# Patient Record
Sex: Male | Born: 1960 | Race: White | Hispanic: No | Marital: Married | Smoking: Never smoker
Health system: Southern US, Community
[De-identification: ages and names within clinical notes are randomized; demographics above are authoritative.]

## PROBLEM LIST (undated history)

## (undated) DIAGNOSIS — J45909 Unspecified asthma, uncomplicated: Secondary | ICD-10-CM

## (undated) DIAGNOSIS — J302 Other seasonal allergic rhinitis: Secondary | ICD-10-CM

## (undated) HISTORY — DX: Unspecified asthma, uncomplicated: J45.909

## (undated) HISTORY — DX: Other seasonal allergic rhinitis: J30.2

---
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
## 2006-01-22 ENCOUNTER — Emergency Department (HOSPITAL_COMMUNITY): Admission: EM | Admit: 2006-01-22 | Discharge: 2006-01-22 | Payer: Self-pay | Admitting: Family Medicine

## 2006-09-10 ENCOUNTER — Emergency Department (HOSPITAL_COMMUNITY): Admission: EM | Admit: 2006-09-10 | Discharge: 2006-09-10 | Payer: Self-pay | Admitting: Emergency Medicine

## 2006-11-27 ENCOUNTER — Emergency Department (HOSPITAL_COMMUNITY): Admission: EM | Admit: 2006-11-27 | Discharge: 2006-11-27 | Payer: Self-pay | Admitting: Emergency Medicine

## 2009-02-23 ENCOUNTER — Emergency Department (HOSPITAL_COMMUNITY): Admission: EM | Admit: 2009-02-23 | Discharge: 2009-02-23 | Payer: Self-pay | Admitting: Family Medicine

## 2009-03-06 ENCOUNTER — Emergency Department (HOSPITAL_COMMUNITY): Admission: EM | Admit: 2009-03-06 | Discharge: 2009-03-06 | Payer: Self-pay | Admitting: Family Medicine

## 2011-01-12 IMAGING — CR DG CHEST 2V
2 series · 2 of 2 positions shown · non-contrast
Comparison: None

CLINICAL DATA: Fever, cough

CHEST - 2 VIEW

[view not recorded (1 of 2)]
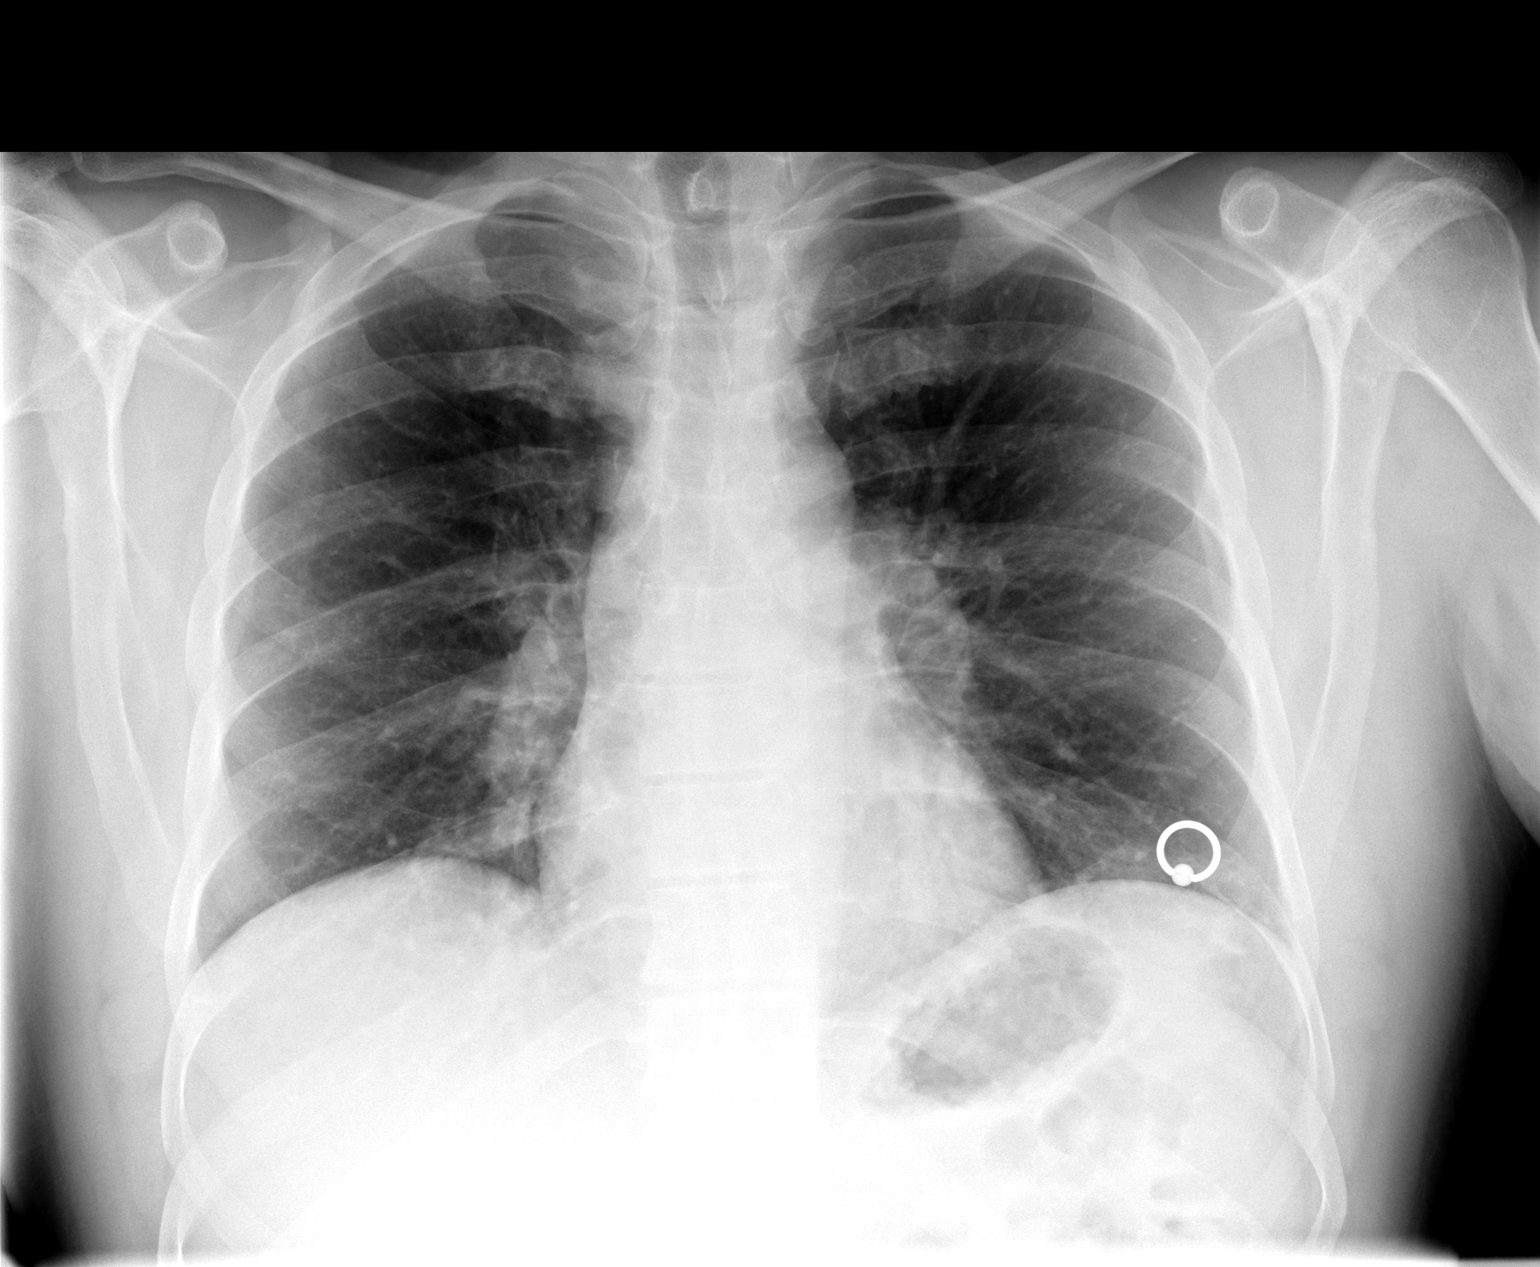

[view not recorded (2 of 2)]
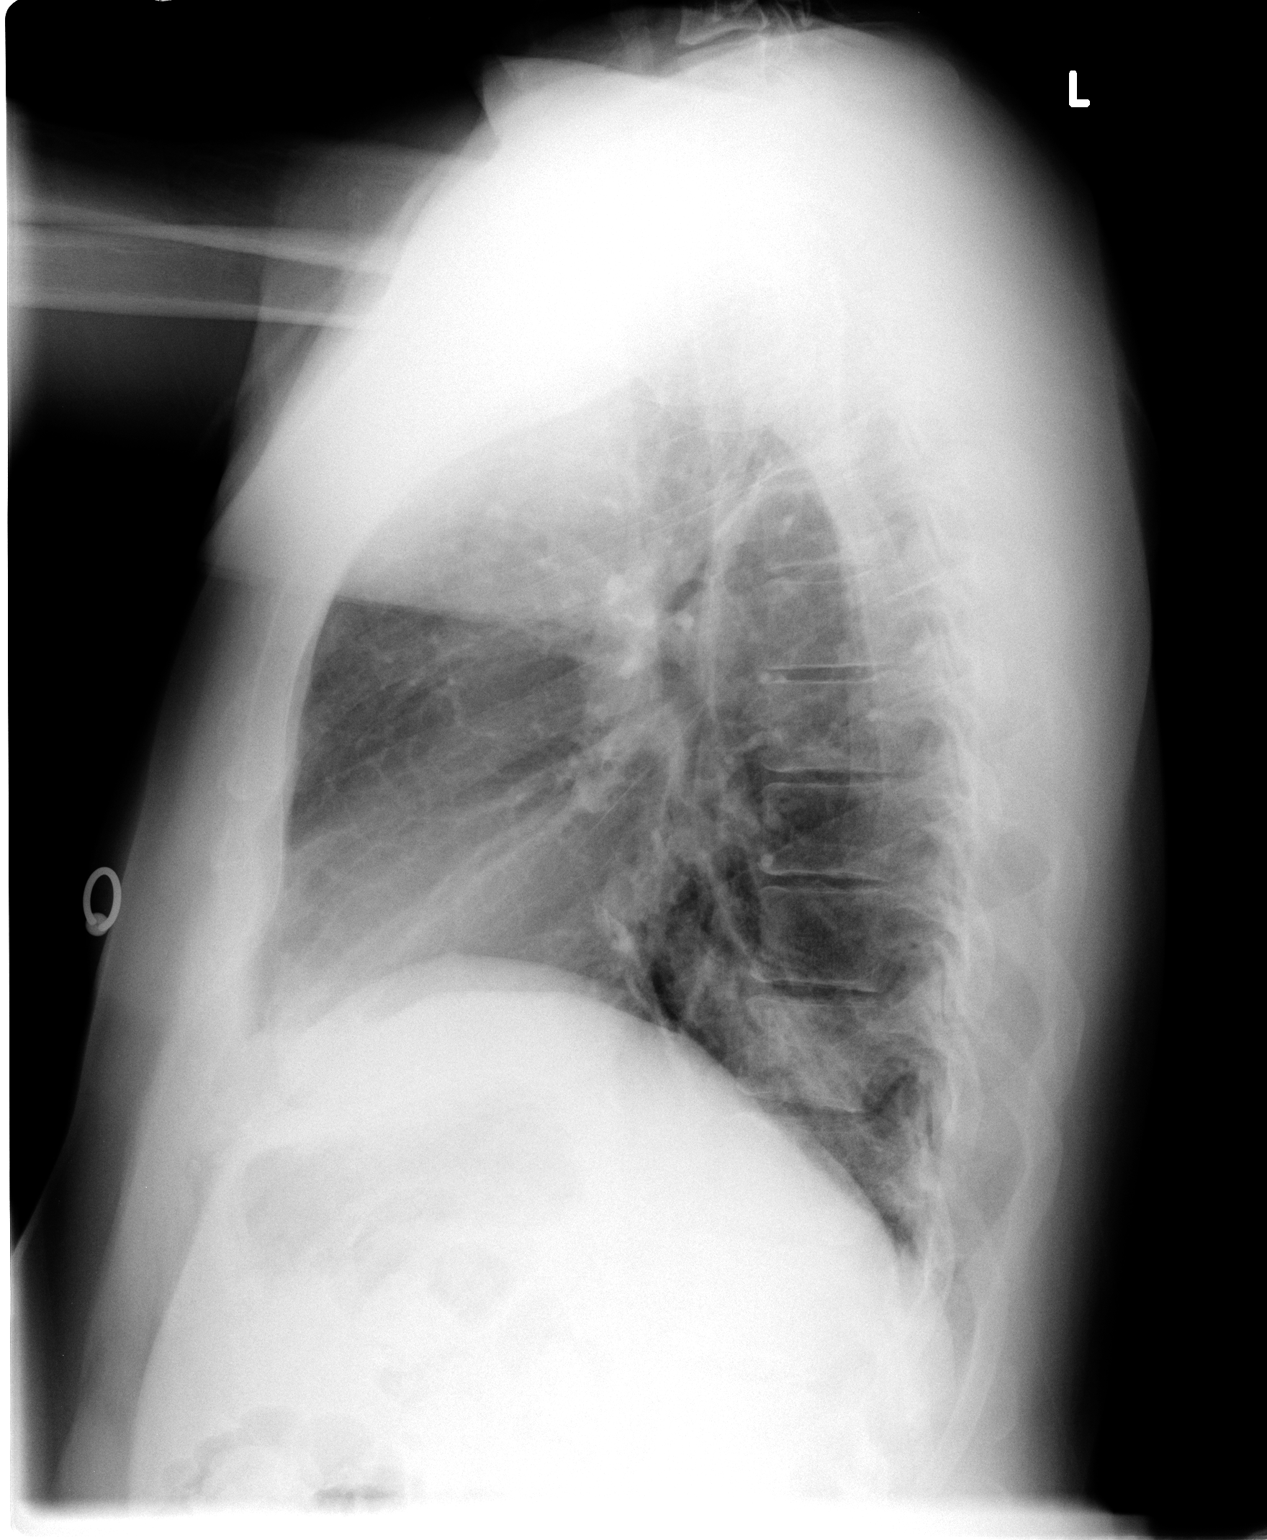

[2 of 2 positions shown; findings below may reference images not displayed]

FINDINGS: Right nipple ring.
Normal heart size, mediastinal contours and pulmonary vascularity.
Bronchitic changes with probable infiltrate left lower lobe.
Remaining lungs show minimal interstitial prominence but are
otherwise clear.
No pleural effusion or pneumothorax.
Bones unremarkable.
IMPRESSION: Probable left lower lobe infiltrate.

## 2012-01-27 HISTORY — PX: LIGATION PRQ VAS DEFERENS UNI/BI SPX: 55450

## 2012-12-15 ENCOUNTER — Emergency Department (HOSPITAL_COMMUNITY)
Admission: EM | Admit: 2012-12-15 | Discharge: 2012-12-15 | Disposition: A | Discharge status: Home / Self Care | Payer: Self-pay | Source: Home / Self Care | Attending: Family Medicine | Admitting: Family Medicine

## 2012-12-15 ENCOUNTER — Encounter (HOSPITAL_COMMUNITY): Payer: Self-pay | Admitting: Emergency Medicine

## 2012-12-15 DIAGNOSIS — H60399 Other infective otitis externa, unspecified ear: Secondary | ICD-10-CM

## 2012-12-15 DIAGNOSIS — H6091 Unspecified otitis externa, right ear: Secondary | ICD-10-CM

## 2012-12-15 MED ORDER — AMOXICILLIN-POT CLAVULANATE 875-125 MG PO TABS: 1.0000 | ORAL_TABLET | Freq: Two times a day (BID) | ORAL | Status: DC

## 2012-12-15 MED ORDER — CIPROFLOXACIN-HYDROCORTISONE 0.2-1 % OT SUSP: 3.0000 [drp] | Freq: Two times a day (BID) | OTIC | Status: DC

## 2012-12-15 NOTE — ED Provider Notes (Signed)
Carl Roth is a 52 y.o. male who presents to Urgent Care today for right ear pain for the last 2 days. Patient denies any fevers or chills nausea vomiting diarrhea or congestion. He feels well otherwise. He has not tried any medications yet. The pain is mild to moderate and worse with ear motion.   History reviewed. No pertinent past medical history. History  Substance Use Topics  . Smoking status: Never Smoker   . Smokeless tobacco: Not on file  . Alcohol Use: Yes   ROS as above Medications reviewed. No current facility-administered medications for this encounter.   Current Outpatient Prescriptions  Medication Sig Dispense Refill  . amoxicillin-clavulanate (AUGMENTIN) 875-125 MG per tablet Take 1 tablet by mouth every 12 (twelve) hours.  14 tablet  0  . ciprofloxacin-hydrocortisone (CIPRO HC) otic suspension Place 3 drops into the right ear 2 (two) times daily.  10 mL  0    Exam:  BP 121/82  Pulse 68  Temp(Src) 97.9 F (36.6 C) (Oral)  Resp 14  SpO2 97% Gen: Well NAD HEENT: EOMI,  MMM, erythematous right auditory canal and  tympanic membrane. No effusion. Left is normal appearing Lungs: Normal work of breathing. CTABL Heart: RRR no MRG Abd: NABS, Soft. NT, ND Exts: Non edematous BL  LE, warm and well perfused.   No results found for this or any previous visit (from the past 24 hour(s)). No results found.  Assessment and Plan: 52 y.o. male with right ear otitis externa.  Plan to treat with Cipro HC ear drops. Additionally I have prescribed Augmentin for use if he does not improve.  Followup as needed.  Discussed warning signs or symptoms. Please see discharge instructions. Patient expresses understanding.      Rodolph Bong, MD 12/15/12 [phone]

## 2012-12-15 NOTE — ED Notes (Signed)
C/o pain in ear since Tuesday; NAD

## 2013-01-03 ENCOUNTER — Encounter: Payer: Self-pay | Admitting: Internal Medicine

## 2013-03-03 ENCOUNTER — Encounter: Payer: Self-pay | Admitting: Internal Medicine

## 2013-03-03 ENCOUNTER — Ambulatory Visit (AMBULATORY_SURGERY_CENTER): Payer: Self-pay | Admitting: *Deleted

## 2013-03-03 VITALS — Ht 72.0 in | Wt 227.6 lb

## 2013-03-03 DIAGNOSIS — Z1211 Encounter for screening for malignant neoplasm of colon: Secondary | ICD-10-CM

## 2013-03-03 MED ORDER — MOVIPREP 100 G PO SOLR: ORAL | Status: DC

## 2013-03-03 NOTE — Progress Notes (Signed)
No allergies to eggs or soy. No prior anesthesia.  

## 2013-03-17 ENCOUNTER — Ambulatory Visit (AMBULATORY_SURGERY_CENTER): Payer: BC Managed Care – PPO | Admitting: Internal Medicine

## 2013-03-17 ENCOUNTER — Encounter: Payer: Self-pay | Admitting: Internal Medicine

## 2013-03-17 VITALS — BP 105/74 | HR 56 | Temp 97.8°F | Resp 19 | Ht 72.0 in | Wt 227.0 lb

## 2013-03-17 DIAGNOSIS — D126 Benign neoplasm of colon, unspecified: Secondary | ICD-10-CM

## 2013-03-17 DIAGNOSIS — Z1211 Encounter for screening for malignant neoplasm of colon: Secondary | ICD-10-CM

## 2013-03-17 MED ORDER — SODIUM CHLORIDE 0.9 % IV SOLN: 500.0000 mL | INTRAVENOUS | Status: DC

## 2013-03-17 NOTE — Op Note (Signed)
[facility]  [address]. [name], [id number]   COLONOSCOPY PROCEDURE REPORT  PATIENT: Carl, Roth  MR#: [id number] BIRTHDATE: 12/24/1960 , 53  yrs. old GENDER: Male ENDOSCOPIST: Jerene Bears, MD PROCEDURE DATE:  03/17/2013 PROCEDURE:   Colonoscopy with snare polypectomy First Screening Colonoscopy - Avg.  risk and is 50 yrs.  old or older Yes.  Prior Negative Screening - Now for repeat screening. N/A  History of Adenoma - Now for follow-up colonoscopy & has been > or = to 3 yrs.  N/A  Polyps Removed Today? Yes. ASA CLASS:   Class II INDICATIONS:average risk screening and first colonoscopy. MEDICATIONS: MAC sedation, administered by CRNA and propofol (Diprivan) 350mg  IV  DESCRIPTION OF PROCEDURE:   After the risks benefits and alternatives of the procedure were thoroughly explained, informed consent was obtained.  A digital rectal exam revealed no rectal mass.   The LB [id number]  endoscope was introduced through the anus and advanced to the cecum, which was identified by both the appendix and ileocecal valve. No adverse events experienced. The quality of the prep was good, using MoviPrep  The instrument was then slowly withdrawn as the colon was fully examined.   COLON FINDINGS: Three sessile polyps ranging between 3-73mm in size were found in the proximal transverse colon (2) and rectosigmoid colon (1).  Polypectomy was performed using cold snare.  All resections were complete and all polyp tissue was completely retrieved.   The colon mucosa was otherwise normal.  Retroflexed views revealed small internal hemorrhoids. The time to cecum=5 minutes 20 seconds.  Withdrawal time=15 minutes 13 seconds.  The scope was withdrawn and the procedure completed. COMPLICATIONS: There were no complications.  ENDOSCOPIC IMPRESSION: 1.   Three sessile polyps ranging between 3-11mm in size were found in the proximal transverse colon and rectosigmoid  colon; Polypectomy was performed using cold snare 2.   The colon mucosa was otherwise normal  RECOMMENDATIONS: 1.  Await pathology results 2.  Timing of repeat colonoscopy will be determined by pathology findings. 3.  You will receive a letter within 1-2 weeks with the results of your biopsy as well as final recommendations.  Please call my office if you have not received a letter after 3 weeks.   eSigned:  Jerene Bears, MD 03/17/2013 9:37 AM   cc: The Patient

## 2013-03-17 NOTE — Progress Notes (Signed)
Called to room to assist during endoscopic procedure.  Patient ID and intended procedure confirmed with present staff. Received instructions for my participation in the procedure from the performing physician.  

## 2013-03-17 NOTE — Patient Instructions (Signed)
YOU HAD AN ENDOSCOPIC PROCEDURE TODAY AT THE [facility]: Refer to the procedure report that was given to you for any specific questions about what was found during the examination.  If the procedure report does not answer your questions, please call your gastroenterologist to clarify.  If you requested that your care partner not be given the details of your procedure findings, then the procedure report has been included in a sealed envelope for you to review at your convenience later.  YOU SHOULD EXPECT: Some feelings of bloating in the abdomen. Passage of more gas than usual.  Walking can help get rid of the air that was put into your GI tract during the procedure and reduce the bloating. If you had a lower endoscopy (such as a colonoscopy or flexible sigmoidoscopy) you may notice spotting of blood in your stool or on the toilet paper. If you underwent a bowel prep for your procedure, then you may not have a normal bowel movement for a few days.  DIET: Your first meal following the procedure should be a light meal and then it is ok to progress to your normal diet.  A half-sandwich or bowl of soup is an example of a good first meal.  Heavy or fried foods are harder to digest and may make you feel nauseous or bloated.  Likewise meals heavy in dairy and vegetables can cause extra gas to form and this can also increase the bloating.  Drink plenty of fluids but you should avoid alcoholic beverages for 24 hours.  ACTIVITY: Your care partner should take you home directly after the procedure.  You should plan to take it easy, moving slowly for the rest of the day.  You can resume normal activity the day after the procedure however you should NOT DRIVE or use heavy machinery for 24 hours (because of the sedation medicines used during the test).    SYMPTOMS TO REPORT IMMEDIATELY: A gastroenterologist can be reached at any hour.  During normal business hours, 8:30 AM to 5:00 PM Monday through Friday,  call [phone].  After hours and on weekends, please call the GI answering service at [phone] who will take a message and have the physician on call contact you.   Following lower endoscopy (colonoscopy or flexible sigmoidoscopy):  Excessive amounts of blood in the stool  Significant tenderness or worsening of abdominal pains  Swelling of the abdomen that is new, acute  Fever of 100F or higher    FOLLOW UP: If any biopsies were taken you will be contacted by phone or by letter within the next 1-3 weeks.  Call your gastroenterologist if you have not heard about the biopsies in 3 weeks.  Our staff will call the home number listed on your records the next business day following your procedure to check on you and address any questions or concerns that you may have at that time regarding the information given to you following your procedure. This is a courtesy call and so if there is no answer at the home number and we have not heard from you through the emergency physician on call, we will assume that you have returned to your regular daily activities without incident.  SIGNATURES/CONFIDENTIALITY: You and/or your care partner have signed paperwork which will be entered into your electronic medical record.  These signatures attest to the fact that that the information above on your After Visit Summary has been reviewed and is understood.  Full responsibility of the confidentiality   of this discharge information lies with you and/or your care-partner.     

## 2013-03-20 ENCOUNTER — Telehealth: Payer: Self-pay | Admitting: *Deleted

## 2013-03-20 NOTE — Telephone Encounter (Signed)
Left message that we called for f/u 

## 2013-03-22 ENCOUNTER — Encounter: Payer: Self-pay | Admitting: Internal Medicine

## 2013-03-29 ENCOUNTER — Encounter: Payer: Self-pay | Admitting: *Deleted

## 2014-05-20 ENCOUNTER — Encounter (HOSPITAL_COMMUNITY): Payer: Self-pay | Admitting: Emergency Medicine

## 2014-05-20 ENCOUNTER — Emergency Department (HOSPITAL_COMMUNITY)
Admission: EM | Admit: 2014-05-20 | Discharge: 2014-05-20 | Disposition: A | Discharge status: Home / Self Care | Payer: BC Managed Care – PPO | Source: Home / Self Care

## 2014-05-20 DIAGNOSIS — J309 Allergic rhinitis, unspecified: Secondary | ICD-10-CM | POA: Diagnosis not present

## 2014-05-20 DIAGNOSIS — H6593 Unspecified nonsuppurative otitis media, bilateral: Secondary | ICD-10-CM

## 2014-05-20 MED ORDER — CETIRIZINE-PSEUDOEPHEDRINE ER 5-120 MG PO TB12: 1.0000 | ORAL_TABLET | Freq: Every day | ORAL | Status: AC

## 2014-05-20 MED ORDER — FLUTICASONE PROPIONATE 50 MCG/ACT NA SUSP: 2.0000 | Freq: Every day | NASAL | Status: AC

## 2014-05-20 NOTE — ED Notes (Signed)
C/o  Sinus pressure and tightness in head and chest.  Denies fever and cough.  No relief with allergy meds.  Symptoms present x 2 days.

## 2014-05-20 NOTE — Discharge Instructions (Signed)
A neti pot is a container designed to rinse debris or mucus from your nasal cavity. You might use a neti pot to treat symptoms of nasal allergies, sinus problems or colds. If you choose to make your own saltwater solution, it's important to use bottled water that has been distilled or sterilized. Tap water is acceptable if it's been boiled for several minutes and then left to cool until it is lukewarm. To use the neti pot, tilt your head sideways over the sink and place the spout of the neti pot in the upper nostril. Breathing through your open mouth, gently pour the saltwater solution into your upper nostril so that the liquid drains through the lower nostril. Repeat on the other side. Be sure to rinse the irrigation device after each use with similarly distilled, sterile, previously boiled and cooled, or filtered water and leave open to air dry. Neti pots are often available in pharmacies and health food stores, as well online.    Allergic Rhinitis Allergic rhinitis is when the mucous membranes in the nose respond to allergens. Allergens are particles in the air that cause your body to have an allergic reaction. This causes you to release allergic antibodies. Through a chain of events, these eventually cause you to release histamine into the blood stream. Although meant to protect the body, it is this release of histamine that causes your discomfort, such as frequent sneezing, congestion, and an itchy, runny nose.  CAUSES  Seasonal allergic rhinitis (hay fever) is caused by pollen allergens that may come from grasses, trees, and weeds. Year-round allergic rhinitis (perennial allergic rhinitis) is caused by allergens such as house dust mites, pet dander, and mold spores.  SYMPTOMS   Nasal stuffiness (congestion).  Itchy, runny nose with sneezing and tearing of the eyes. DIAGNOSIS  Your health care provider can help you determine the allergen or allergens that trigger your symptoms. If you and your  health care provider are unable to determine the allergen, skin or blood testing may be used. TREATMENT  Allergic rhinitis does not have a cure, but it can be controlled by:  Medicines and allergy shots (immunotherapy).  Avoiding the allergen. Hay fever may often be treated with antihistamines in pill or nasal spray forms. Antihistamines block the effects of histamine. There are over-the-counter medicines that may help with nasal congestion and swelling around the eyes. Check with your health care provider before taking or giving this medicine.  If avoiding the allergen or the medicine prescribed do not work, there are many new medicines your health care provider can prescribe. Stronger medicine may be used if initial measures are ineffective. Desensitizing injections can be used if medicine and avoidance does not work. Desensitization is when a patient is given ongoing shots until the body becomes less sensitive to the allergen. Make sure you follow up with your health care provider if problems continue. HOME CARE INSTRUCTIONS It is not possible to completely avoid allergens, but you can reduce your symptoms by taking steps to limit your exposure to them. It helps to know exactly what you are allergic to so that you can avoid your specific triggers. SEEK MEDICAL CARE IF:   You have a fever.  You develop a cough that does not stop easily (persistent).  You have shortness of breath.  You start wheezing.  Symptoms interfere with normal daily activities. Document Released: 10/07/2000 Document Revised: 01/17/2013 Document Reviewed: 09/19/2012 [facility] Patient Information 2015 [place], [place]. This information is not intended to replace advice given  to you by your health care provider. Make sure you discuss any questions you have with your health care provider.

## 2014-05-20 NOTE — ED Provider Notes (Signed)
CSN: [id number]     Arrival date & time 05/20/14  1026 History   None    Chief Complaint  Patient presents with  . Facial Pain   (Consider location/radiation/quality/duration/timing/severity/associated sxs/prior Treatment) HPI   Patient is a 55 year old male presenting today with complaints of congestion and a feeling of fullness and stuffiness in his head and in the ears for 2 days. He is concerned that he may have picked up something from his daughter who has been intermittently coughing for the past several weeks. He denies nausea, vomiting, and diarrhea. No fever, but positive for fatigue and just a general feeling of malaise. He regularly takes Claritin-D for seasonal allergies. Primary concern is ear pressure, stuffiness and scratchiness as he will be flying later this week.  Past Medical History  Diagnosis Date  . Seasonal allergies   . Asthma    Past Surgical History  Procedure Laterality Date  . Laceration repair Left 1980   Family History  Problem Relation Age of Onset  . Colon cancer Neg Hx    History  Substance Use Topics  . Smoking status: Never Smoker   . Smokeless tobacco: Never Used  . Alcohol Use: 8.4 oz/week    14 Cans of beer per week    Review of Systems  Constitutional: Positive for fatigue. Negative for fever, chills and diaphoresis.  HENT: Positive for congestion, postnasal drip, rhinorrhea, sinus pressure and sneezing. Negative for drooling, ear discharge, ear pain, facial swelling, hearing loss, sore throat, trouble swallowing and voice change.   Eyes: Negative.   Respiratory: Negative.  Negative for cough, shortness of breath and wheezing.   Cardiovascular: Negative.   Gastrointestinal: Negative.  Negative for nausea, vomiting and diarrhea.  Endocrine: Negative.   Genitourinary: Negative.   Skin: Negative.  Negative for color change, pallor, rash and wound.  Allergic/Immunologic: Positive for environmental allergies. Negative for food allergies and  immunocompromised state.  Neurological: Negative.   Hematological: Negative.   Psychiatric/Behavioral: Negative.     Allergies  Dust mite extract; Molds & smuts; and Pollen extract  Home Medications   Prior to Admission medications   Medication Sig Start Date End Date Taking? Authorizing Provider  ALBUTEROL IN Inhale into the lungs as needed.    Historical Provider, MD  budesonide-formoterol (SYMBICORT) 160-4.5 MCG/ACT inhaler Inhale 2 puffs into the lungs 2 (two) times daily.    Historical Provider, MD  cetirizine-pseudoephedrine (ZYRTEC-D) 5-120 MG per tablet Take 1 tablet by mouth daily. 05/20/14   Nehemiah Settle, NP  fluticasone (FLONASE) 50 MCG/ACT nasal spray Place 2 sprays into both nostrils daily. 05/20/14   Nehemiah Settle, NP   BP 141/80 mmHg  Pulse 73  Temp(Src) 98 F (36.7 C) (Oral)  Resp 16  SpO2 97%   Physical Exam  Constitutional: He is oriented to person, place, and time. He appears well-developed and well-nourished. No distress.  HENT:  Head: Normocephalic and atraumatic.  Right Ear: External ear normal.  Left Ear: External ear normal.  Mouth/Throat: Oropharynx is clear and moist. No oropharyngeal exudate.  Bilateral tympanic membranes pearly gray in appearance with light reflexes present and bony prominences visualized. Light reflexes distorted secondary to fluid present behind tympanic membranes. Only partially able to visualize bony prominences. Bilateral nares are patent but boggy and bluish in appearance. Posterior oropharynx has mild irritation consistent with postnasal drip. Cobblestoning pattern present; no evidence of exudate or patches.   Eyes: Conjunctivae are normal. Pupils are equal, round, and reactive to light. Right eye  exhibits no discharge. Left eye exhibits no discharge.  Neck: Normal range of motion. Neck supple.  Mild anterior cervical lymphadenopathy. No nuchal rigidity.  Cardiovascular: Normal rate, regular rhythm, normal heart sounds  and intact distal pulses.  Exam reveals no gallop and no friction rub.   No murmur heard. Pulmonary/Chest: Effort normal and breath sounds normal. No respiratory distress. He has no wheezes. He has no rales. He exhibits no tenderness.  No adventitious breath sounds noted.  Lymphadenopathy:    He has cervical adenopathy.  Neurological: He is alert and oriented to person, place, and time.  Skin: Skin is warm and dry. No rash noted. He is not diaphoretic. No pallor.  Nursing note and vitals reviewed.   ED Course  Procedures (including critical care time) Labs Review Labs Reviewed - No data to display  Imaging Review No results found.   MDM   1. Allergic rhinitis, unspecified allergic rhinitis type   2. Bilateral serous otitis media, recurrence not specified, unspecified chronicity    Meds ordered this encounter  Medications  . fluticasone (FLONASE) 50 MCG/ACT nasal spray    Sig: Place 2 sprays into both nostrils daily.    Dispense:  16 g    Refill:  2  . cetirizine-pseudoephedrine (ZYRTEC-D) 5-120 MG per tablet    Sig: Take 1 tablet by mouth daily.    Dispense:  15 tablet    Refill:  0   Discussed plan of care with patient. Patient switched from Manti to Zyrtec-D to try alternative medication to see if it improves allergic symptoms. In addition discussed the use of steroid nasal spray and saline irrigation for relief of symptoms and congestion. The patient verbalizes understanding and agrees to plan of care.       Nehemiah Settle, NP 05/20/14 1420

## 2017-09-30 ENCOUNTER — Ambulatory Visit (INDEPENDENT_AMBULATORY_CARE_PROVIDER_SITE_OTHER): Payer: No Typology Code available for payment source | Admitting: Urology

## 2017-09-30 ENCOUNTER — Encounter (INDEPENDENT_AMBULATORY_CARE_PROVIDER_SITE_OTHER): Payer: Self-pay | Admitting: Urology

## 2017-09-30 ENCOUNTER — Ambulatory Visit: Payer: No Typology Code available for payment source | Attending: Urology

## 2017-09-30 VITALS — BP 128/62 | HR 64 | Ht 72.0 in | Wt 219.6 lb

## 2017-09-30 DIAGNOSIS — R351 Nocturia: Secondary | ICD-10-CM | POA: Insufficient documentation

## 2017-09-30 DIAGNOSIS — R972 Elevated prostate specific antigen [PSA]: Secondary | ICD-10-CM | POA: Insufficient documentation

## 2017-09-30 DIAGNOSIS — Z6829 Body mass index (BMI) 29.0-29.9, adult: Secondary | ICD-10-CM

## 2017-09-30 LAB — LAB UNDEFINED ORCA/EPIC ORDER

## 2017-09-30 MED ORDER — CIPROFLOXACIN HCL 500 MG OR TABS
500.0000 mg | ORAL_TABLET | Freq: Two times a day (BID) | ORAL | 0 refills | Status: AC
Start: 2017-09-30 — End: 2017-10-01

## 2017-09-30 NOTE — Progress Notes (Signed)
UROLOGY NEW PATIENT VISIT    ID/CC:    Chief Complaint   Patient presents with    Elevated PSA     PSA 7.3       History of Present Illness:   Jeffery Elliott is a 57 year old male who presents today.  He is referred for an elevated PSA of 7.3.  His last PSA was several years ago he does have a history of his father having prostate cancer treated with radiation therapy in his 84s he has what sounds like some obstructive voiding symptoms with nocturia 3 and some difficulty when sitting to void    Review of Systems:   A 14 system review of system was filled out by the patient.  Positives include no other major medical problems.  The rest of the review of system is negative and can be linked to in the chart dated today.        Past Medical Hx:    Past Medical History:   Diagnosis Date    Asthma     Elevated PSA      Patient Active Problem List   Diagnosis    Elevated PSA       Past Surgical Hx:    Past Surgical History:   Procedure Laterality Date    LIGATION PRQ VAS DEFERENS UNI/BI SPX  2014       Family and Social Hx:    Jeffery Elliott , family history includes Heart Attack in his brother and father; Prostate Cancer in his father.Marland Kitchen He  reports that he has never smoked. He has never used smokeless tobacco. He reports that he drank alcohol. He reports that he has current or past drug history..  Social History     Social History Narrative    Not on file       Active Meds:    No outpatient medications have been marked as taking for the 09/30/17 encounter (Office Visit) with Daphine Deutscher, MD.       Allergies:    Allergies as of 09/30/2017 - Reviewed 09/30/2017   Allergen Reaction Noted    Dust mite extract  09/30/2017    Molds & smuts  09/30/2017    Pollen extract  09/30/2017       OBJECTIVE:  Physical Exam:  BP 128/62    Pulse 64    Ht 6' (1.829 m) Comment: patient reported   Wt (!) 219 lb 9.6 oz (99.6 kg)    BMI 29.78 kg/m   Constitutional: ECOG normal  HEENT:  wnl  Lymphatic:  no adenopathy.  Resp:   normal effort  CV:  normal pulses, no peripheral edema  Abdomen:  no abdominal masses, no CVA tenderness and small left inguinal hernia noted.  GU Exam:       circumcised penis. normal meatus with normal calibur     Testes descended bilaterally, Epidimymis normal to palpation,     Perineum normal to visual inspection. Sphincter with normal tone,  Prostate nontender  approximately 40 grams in size with no nodularity  Extremities:  no cyanosis.  no edema.  Neuro/Psych: affect normal .      ASSESSMENT/PLAN:   Jeffery Elliott is a 57 year old male with an elevated PSA of 7.3 and a positive family history of prostate cancer.  He has some obstructive voiding symptoms but normal to moderate size prostate.  I think we should do a biopsy.  This was discussed at length with the pros and cons and  risks.  He agrees with the plan.  All questions answered.

## 2017-10-05 LAB — MICROBIOLOGY/SEROLOGY SENDOUT

## 2017-10-12 ENCOUNTER — Other Ambulatory Visit (HOSPITAL_BASED_OUTPATIENT_CLINIC_OR_DEPARTMENT_OTHER)
Admit: 2017-10-12 | Discharge: 2017-10-12 | Disposition: A | Discharge status: Home / Self Care | Payer: No Typology Code available for payment source | Attending: Urology | Admitting: Urology

## 2017-10-12 ENCOUNTER — Ambulatory Visit (INDEPENDENT_AMBULATORY_CARE_PROVIDER_SITE_OTHER): Payer: No Typology Code available for payment source | Admitting: Urology

## 2017-10-12 ENCOUNTER — Encounter (INDEPENDENT_AMBULATORY_CARE_PROVIDER_SITE_OTHER): Payer: Self-pay | Admitting: Urology

## 2017-10-12 VITALS — BP 144/84 | HR 60 | Ht 72.0 in | Wt 223.0 lb

## 2017-10-12 DIAGNOSIS — C61 Malignant neoplasm of prostate: Secondary | ICD-10-CM | POA: Insufficient documentation

## 2017-10-12 DIAGNOSIS — N4232 Atypical small acinar proliferation of prostate: Secondary | ICD-10-CM

## 2017-10-12 DIAGNOSIS — R972 Elevated prostate specific antigen [PSA]: Secondary | ICD-10-CM

## 2017-10-12 MED ORDER — CEFTRIAXONE SODIUM 1 G IJ SOLR
1.0000 g | Freq: Once | INTRAMUSCULAR | Status: AC
Start: 2017-10-12 — End: 2017-10-12
  Administered 2017-10-12: 1 g via INTRAMUSCULAR

## 2017-10-12 NOTE — Progress Notes (Signed)
Preoperative Diagnosis/indication       Mr. Jeffery Elliott is a 57 year old year old White male here for prostate biopsy.     Postoperative Diagnosis   Same     Operative Procedure   Diagnostic transrectal ultrasound  ultrasound guidance for prostate needle biopsy  Prostate needle biopsies      Anesthesia   Injectable lidocaine without epi, 5.0 cc in each neurovascular bundle    Specimens   Prostate needle biopsies     Consent   Informed consent was obtained from the patient.    Findings:  His prostate volume was estimated at 40 cc.  Heterogeneous, especially at the base    Procedure   The patient was taken to the Examination Room after receiving pre-operative antibiotics and informed consent obtained.  A time out was performed confirming the patient and the planned procedure.  The patient was placed on the examination table in the left lateral decubitus position.  The ultrasound transducer was introduced into the rectum with lidocaine gel.  The prostate was scanned in the axial and sagittal planes.  It appears heterogenous without specific suspicious areas.     Ten cc of 1.0% lidocaine was injected, 5cc near each neurovascular bundle.  Under ultrasonic guidance, the prostate needle biopsy was performed at the base, midportion, and apex of each lobe of the prostate.  Two cores were obtained from each sextant for a total of twelve cores. The probe was removed. The patient tolerated the procedure well.       Attending Statement  I personally performed the entire procedure from beginning to end.       Disposition   He was discharged home and tolerated the procedure well.  He was reminded of the probability of bleeding per rectum and urethra.  He was instructed to call if he has any fevers or chills.  He will return next week to review results.

## 2017-10-12 NOTE — Progress Notes (Signed)
10/12/2017    Patient states he completed fleets enema and started Cipro 500 mg 2 hours prior to arrival for his prostate biopsy. He states he is not taking aspirin or any other blood thinning medications.    Bennie Pierini, [place]

## 2017-10-14 LAB — PATHOLOGY, SURGICAL

## 2017-10-19 ENCOUNTER — Ambulatory Visit (INDEPENDENT_AMBULATORY_CARE_PROVIDER_SITE_OTHER): Payer: No Typology Code available for payment source | Admitting: Urology

## 2017-10-19 ENCOUNTER — Encounter (INDEPENDENT_AMBULATORY_CARE_PROVIDER_SITE_OTHER): Payer: Self-pay | Admitting: Urology

## 2017-10-19 VITALS — BP 122/88 | HR 72 | Resp 14 | Wt 218.0 lb

## 2017-10-19 DIAGNOSIS — C61 Malignant neoplasm of prostate: Secondary | ICD-10-CM

## 2017-10-19 DIAGNOSIS — Z6829 Body mass index (BMI) 29.0-29.9, adult: Secondary | ICD-10-CM

## 2017-10-19 HISTORY — DX: Malignant neoplasm of prostate: C61

## 2017-10-19 NOTE — Progress Notes (Signed)
UROLOGY CLINIC NOTE    Reason for Visit:   Chief Complaint   Patient presents with    Elevated PSA     Bx discussion     History of Present Illness: Jeffery Elliott is a 57 year old male who returns to me today.  He is here to review the results of his biopsy.  Unfortunately it does show prostate cancer in all 3 right hand sextants, Gleason 3+3     Active Meds:   No outpatient medications have been marked as taking for the 10/19/17 encounter (Office Visit) with Daphine Deutscher, MD.     Allergies: Review of patient's allergies indicates:  Allergies   Allergen Reactions    Dust Mite Extract     Molds & Smuts     Pollen Extract        OBJECTIVE:  Physical Exam: BP 122/88    Pulse 72    Resp 14    Wt 218 lb (98.9 kg)    BMI 29.57 kg/m   Constitutional: WDWN, pleasant and appropriate affect and no acute distress    We discussed the staging and grading of prostate cancer along with the natural history of prostate cancer and the low case fatality rate.  We talked about the different treatment options, including active surveillance, surgery (radical prostatectomy through either an open or robotic approach), radiation (external beam, proton therapy, and brachytherapy), cryotherapy, HIFU, and hormonal therapy. I indicated that I did not think hormonal therapy was a good option as it is non-curative and has a number of potential side effects.      We discussed active surveillance for men with low risk disease.  Typically, candidates for active surveillance have in addition to T1c/T2a PSA < 10 Gleason 3+3 disease, also have fewer than 3 cores positive and no core with more than 50% involved with tumor.  I explained that with active surveillance, we wouled recommend monitoring of the PSA, DRE and doing scheduled repeat biopsies in the future, usually one within the first year, and then every 1-2 years thereafter.     We talked about the equivalent oncologic control between surgery, radiation, and, at least in the  short-term, cryotherapy, as well.  I talked about the robotic surgery and the open surgery.  We discussed the risks of surgery, including peri-operative risks such as bleeding requiring transfusion, infection, deep venous thrombosis/pulmonary embolus, lymphocele, nerve injury and rectal injury.  We discussed the risk of stress urinary incontinence and impotence and the fact that surgery includes a vasectomy.  With regards to radiation, we talked about the complications of proctitis and cystitis and hematuria.  Irritative and obstructive voiding symptoms are more commonly seen with brachytherapy than EBRT.  Recto-urethral fistulas are very rare as are secondary malignancies following radiation treatment.  Erectile dysfunction can occur.  We also talked about the adjuvant/salvage treatments available with radiation following surgery and cryotherapy following radiation.      The patient had the opportunity for all his questions to be answered.      ASSESSMENT/PLAN:    He has right-sided Gleason 3+3 prostate cancer in all sextants we had the discussion as mentioned above.  He had a lot of questions.  Over an hour was spent in this consultation with more than 50% face to face reviewing his pathology and discussing options.  Given his young age and entire right sided involvement I think his best option is surgery.  He wants to talk to a couple surgeons and they  have given him information and referrals.  All questions were answered to the best of my ability.

## 2017-11-01 ENCOUNTER — Encounter (INDEPENDENT_AMBULATORY_CARE_PROVIDER_SITE_OTHER): Payer: Self-pay | Admitting: Urology

## 2017-11-01 ENCOUNTER — Ambulatory Visit (INDEPENDENT_AMBULATORY_CARE_PROVIDER_SITE_OTHER): Payer: No Typology Code available for payment source | Admitting: Urology

## 2017-11-01 VITALS — BP 96/59 | HR 65 | Temp 97.7°F | Ht 72.01 in | Wt 217.0 lb

## 2017-11-01 DIAGNOSIS — C61 Malignant neoplasm of prostate: Secondary | ICD-10-CM

## 2017-11-01 DIAGNOSIS — K4090 Unilateral inguinal hernia, without obstruction or gangrene, not specified as recurrent: Secondary | ICD-10-CM | POA: Insufficient documentation

## 2017-11-01 DIAGNOSIS — Z6829 Body mass index (BMI) 29.0-29.9, adult: Secondary | ICD-10-CM

## 2017-11-01 NOTE — Progress Notes (Signed)
Identifying Information:  Jeffery Elliott is a 57 year old male here to discuss his newly diagnosed clinical T1c Gleason 3+3 prostate cancer with pretreatment PSA 7.3 ng/mL    History of Present Illness:  History was from the patient and outside records which were reviewed. He had a PSA of 7.3 ng/mL.  He saw Dr. Albertine Patricia who performed a 12 core prostate needle biopsy on October 12, 2017.  This revealed a Gleason 3+3 adenocarcinoma of the prostate in 7 cores.  The slides were reviewed here at the [facility].  The prostate volume on transrectal ultrasound was estimated at 40 cc.  He has some voiding symptoms with nocturia 2-3 times per night, and decreased flow especially after sitting or waking up.  He denies hematuria or dysuria.  He has no musculoskeletal complaints of metastatic disease.  He reports functional erections nearly 100 % of the time.   His father had prostate cancer and coronary artery disease.      Past Medical Hx:    Patient Active Problem List   Diagnosis    Elevated PSA    Nocturia    Prostate cancer       Past Surgical Hx:    Past Surgical History:   Procedure Laterality Date    LIGATION PRQ VAS DEFERENS UNI/BI SPX  2014       Active Meds:    No outpatient medications have been marked as taking for the 11/01/17 encounter (Office Visit) with Eloy End, MD.       Allergies:    Allergies as of 11/01/2017 - Reviewed 11/01/2017   Allergen Reaction Noted    Dust mite extract  09/30/2017    Molds & smuts  09/30/2017    Pollen extract  09/30/2017       Family Hx:    Family history of prostate cancer in his father..    Social Hx:  He is a never smoker.  He does not currently consume alcohol.  He is employed by [employer].    Review of Systems:   The clinic review of system was filled out by the patient.  Positives include some nocturia. The rest of the review of system is negative and can be linked to in the chart dated today.    OBJECTIVE:  Physical Exam:    BP 96/59    Pulse 65    Temp  97.7 F (36.5 C) (Oral)    Ht 6' 0.01" (1.829 m)    Wt 217 lb (98.4 kg)    BMI 29.42 kg/m    General: alert, no distress, healthy, well nourished   Lymphatic: No cervical, supraclavicular, or inguinal lymphadenopathy.   Head: Normocephalic. No masses or lesions.   Neck:  Supple without masses  Lungs: deferred   Heart: exam deferred   Abd: Soft, non-tender, without masses or hepatosplenomegaly.  No CVA tenderness.  Left inguinal hernia non-tender  Genitourinary: Normal circumcised penis with no lesions noticed. Orthotopic meatus that is patent. No urethral discharge.  No scrotal lesions.Testes descended bilaterally, without palpable masses, in dependent position within scrotum.  Epididymis normal to palpation bilaterally.    Rectal: deferred as recent biopsy.  Neurological: Normal without focal findings and Alert and oriented x 3, muscle tone and strength normal, symmetric.  Extremities: No edema.    ASSESSMENT:  Jeffery Elliott is a 57 year old male with recently diagnosed clinical T1c Gleason 3+3 prostate cancer with pretreatment PSA 7.3 ng/mL.  He has mild lower urinary tract symptoms mainly  nocturia.  He has normal erections.  His prostate cancer meets NCCN low risk criteria.  We reviewed the findings and the treatment options.  We reviewed the NCCN guidelines for treatment.    PLAN:   1.  Consider options for low risk prostate cancer: active surveillance, prostate removal operation or radiation therapy.  We discussed the risks and benefits associated with each approach.  2.  Consider a referral to Radiation Oncology to learn more about the radiation option.  3.  I explained that active surveillance would involve closely following the prostate cancer with periodic PSA testing and biopsies.  4.  I encouraged him to check the web: PokerWatcher.com.ee, www.nccn.org, www.cancer.net, VerticalStretch.be, www.cancer.org, and [email or web address].  5.  He may contact me by phone, email or eCare message. Office [phone],  [email or web address].  6.  He will consider his options and will follow in 4-6 weeks to make treatment plan.  7.  At his request I placed a referral to General Surgery for the left hernia.  If he chooses to undergo radical prostatectomy we could perform left inguinal hernia repair at that same time.    I spent a total time of 60 minutes face-to-face with the patient, of which more than 50% was spent counseling as outlined in the note above

## 2017-11-01 NOTE — Patient Instructions (Addendum)
1. Consider options for low risk prostate cancer: active surveillance, prostate removal operation or radiation therapy.  2. Consider a referral to Radiation Oncology to learn more about the radiation option.  3. Active surveillance would involve closely following the prostate cancer with periodic PSA testing and biopsies.  4. Check the web: PokerWatcher.com.ee, www.nccn.org, www.cancer.net, VerticalStretch.be, www.cancer.org, and [email or web address].  5. Contact me by phone, email or eCare message. Office [phone], [email or web address].  6. Follow in 4-6 weeks.  7. Consider a referral to General Surgery for the left hernia.

## 2017-11-17 ENCOUNTER — Telehealth (INDEPENDENT_AMBULATORY_CARE_PROVIDER_SITE_OTHER): Payer: Self-pay | Admitting: Urology

## 2017-11-17 DIAGNOSIS — C61 Malignant neoplasm of prostate: Secondary | ICD-10-CM

## 2017-11-17 NOTE — Telephone Encounter (Signed)
I called patient to discuss any further questions. He has sought second opinions outside [facility] and potentially seeking more. I again described the context of young healthy patient with low risk Gleason 3+3 prostate cancer predominantly on left cores with normal urinary and sexual function. I explained the options again for low risk disease. He remains concerned that if he waits longer that his cancer will grow and make treatment and recovery more difficult especially the ability to perform nerve sparing surgery.  I understood his point of view and it is reasonable to proceed with surgery. I also again explained active surveillance and the strategy and rationale. We discussed further stratification of his cancer. I suggested prostate MRI. If no significant lesion perhaps greater confidence in active surveillance  And also a target to ensure we sample on his first surveillance biopsy. If still wants to proceed with surgery, it may provide an assessment of extent of disease and inform our ability to perform nerve sparing on left and exclude any larger lesion on right to confirm OK for nerve sparing on right. I explained not want to obtain MRI too soon after biopsy due to artifact. I suggest 6-8 weeks. I ordered it now. I will ask team to let patient know he can schedule in that time frame at [facility].

## 2017-11-26 ENCOUNTER — Ambulatory Visit (HOSPITAL_BASED_OUTPATIENT_CLINIC_OR_DEPARTMENT_OTHER): Payer: No Typology Code available for payment source

## 2017-11-26 ENCOUNTER — Ambulatory Visit: Payer: No Typology Code available for payment source | Attending: Radiation Oncology | Admitting: Radiation Oncology

## 2017-11-26 DIAGNOSIS — C61 Malignant neoplasm of prostate: Secondary | ICD-10-CM | POA: Insufficient documentation

## 2017-12-03 NOTE — Telephone Encounter (Signed)
Team,     Can you let patient know his biopsy was done 10/12/17 and that he can do MRI sometime early to mid November. I think better to do it at [facility] where I placed order for. Thanks. AD    Routing comment

## 2017-12-03 NOTE — Telephone Encounter (Signed)
Called pt and relayed message below per AD, pt had no further questions

## 2017-12-06 ENCOUNTER — Telehealth (INDEPENDENT_AMBULATORY_CARE_PROVIDER_SITE_OTHER): Payer: Self-pay | Admitting: Urology

## 2017-12-06 NOTE — Telephone Encounter (Signed)
Patient emailed me to my [place] email. He has f/u appointment scheduled on 12/15/17 but his prostate MRI not until 12/17/17.     We agreed to reschedule appointment to a date after MRI done.

## 2017-12-09 NOTE — Telephone Encounter (Signed)
Thanks for rescheduling. AD

## 2017-12-15 ENCOUNTER — Encounter (INDEPENDENT_AMBULATORY_CARE_PROVIDER_SITE_OTHER): Payer: No Typology Code available for payment source | Admitting: Urology

## 2017-12-17 ENCOUNTER — Ambulatory Visit: Payer: No Typology Code available for payment source | Attending: Urology

## 2017-12-17 DIAGNOSIS — C61 Malignant neoplasm of prostate: Secondary | ICD-10-CM

## 2017-12-27 ENCOUNTER — Ambulatory Visit (INDEPENDENT_AMBULATORY_CARE_PROVIDER_SITE_OTHER): Payer: No Typology Code available for payment source | Admitting: Urology

## 2017-12-27 VITALS — BP 104/69 | HR 63 | Wt 214.0 lb

## 2017-12-27 DIAGNOSIS — Z6829 Body mass index (BMI) 29.0-29.9, adult: Secondary | ICD-10-CM

## 2017-12-27 DIAGNOSIS — C61 Malignant neoplasm of prostate: Secondary | ICD-10-CM

## 2017-12-27 NOTE — Patient Instructions (Addendum)
1. Again consider your options: active surveillance, radiation, surgery.  2. We could consider obtaining a molecular marker such as Prolaris or Oncotype Dx or Decipher tests. I'm skeptical that these are meaningful.  3. I'd estimate 33-50% likelihood of patient like you on active surveillance changing to active treatment.  4. If you choose surgery, I'd be happy to perform it or continue as planned with Dr. Starleen Blue at [facility].  5. If continuing with Dr. Starleen Blue, then can follow with [place] as needed.  6. I encourage you to remain healthy with good diet, exercise and keeping weight down.

## 2017-12-27 NOTE — Progress Notes (Signed)
UROLOGY CLINIC NOTE      Reason for Visit:  Prostate cancer    History of Present Illness:    Jeffery Elliott is a 57 year old male who returns  to me for prostate cancer. He was initially diagnosed with cT1c Gleason 3+3 prostate cancer and deliberating over options. Prostate MRI done last week noticing only PI-RADS 2 lesion.  Patient and I have also been communicating electronically.  He has continued to weigh his options.  He is ready scheduled robotic prostatectomy with Dr. Pedro Earls at [facility] later in the month.       Past Medical Hx:    Patient Active Problem List   Diagnosis   . Elevated PSA   . Nocturia   . Prostate cancer ([place])   . Inguinal hernia of left side without obstruction or gangrene       Past Surgical Hx:    Past Surgical History:   Procedure Laterality Date   . LIGATION PRQ VAS DEFERENS UNI/BI SPX  2014       Social Hx:     reports that he has never smoked. He has never used smokeless tobacco. He reports previous alcohol use. He reports previous drug use..  Social History     Patient does not qualify to have social determinant information on file (likely too young).   Social History Narrative   . Not on file       Active Meds:      No outpatient medications have been marked as taking for the 12/27/17 encounter (Appointment) with Eloy End, MD.           OBJECTIVE:  Physical Exam:    Vital Signs:  There were no vitals taken for this visit.  Constitutional: No acute distress  No other exam today.    Results for orders placed or performed in visit on 10/12/17   PATHOLOGY, SURGICAL   Result Value Ref Range    Demographics       ** DEMOGRAPHICS DRAWN FROM PATHOLOGY REPORT **  PATIENT:   Jeffery Elliott  MRN:       [id number]     [facility])  DOB:       11/22/1960  SEX:       M  .  CASE:   [id number]   COLLECTED:  Oct 12 2017    RECEIVED: Oct 12 2017      Report Path/Cyto             _______________________________________________________  _________________________________________    FINAL  DIAGNOSIS:      Prostate needle core biopsies as designated:   - A) Left base  Benign prostatic tissue   - B) Left mid  Benign prostatic tissue   - C) Left apex  Atypical small acinar proliferation (ASAP)   - D) Right base  Carcinoma   Cancer length: 1.2cm   Total core length:   3.3cm   Pos cores: 3/3  Gleason Grade Primary: 3   Secondary: 3   Score: 6   - E) Right mid  Carcinoma   Cancer length: 1cm   Total core length:   2.9cm   Pos cores: 2/2  Gleason Grade Primary: 3   Secondary: 3   Score: 6   - F) Right apex  Carcinoma   Cancer length: 1cm   Total core length:   2.7cm   Pos cores: 2/2  Gleason grade Primary: 3   Secondary: 3   Score: 6  Summary Findings  Histologic type: Acinar Adenocarcinoma  Gleason grade: Primary: 3   Secondary: 3   Score: 6  Grade group (WHO 2016): 1  The five Grade Groups, which are based on Gleason   grades, correlate with the aggressiveness of  the   cancer. The range is from 1 (least aggressive) to 5   (most aggressive). (PubMed ID s [id number], [id number])         _______________________________________________________  _________________________________________    CLINICAL DATA:  Elevated PSA.    GROSS DESCRIPTION:  A) Received in formalin labeled "Milinda Hirschfeld; left   base" are two cores of soft tissue, each measuring 1.2   cm in length.  The specimens are wrapped and entirely   submitted in A1.  (CR/cmm)      B) Received in formalin labeled "Milinda Hirschfeld; left   mid" are two cores of soft tissue, each measuring 2.0   cm in length.  The specimens are wrapped and entirely   submitted in B1.  (CR/cmm)      C) Received in formalin labeled "Milinda Hirschfeld; left   apex" is a single tan core of tissue, 1.5 cm in length.    The specimen is wrapped and entirely submitted in C1.    (CR/cmm)      D) Received in formalin labeled "Milinda Hirschfeld; right   base" are three cores of soft tissue, ranging from   0.3-2.0 cm in length.  T he specimens are wrapped and   entirely submitted in D1.   (CR/cmm)      E) Received in formalin labeled "Milinda Hirschfeld; right   mid" are two cores of soft tissue, each measuring 1.9   cm in length.  The specimens are wrapped and entirely   submitted in E1.  (CR/cmm)      F) Received in formalin labeled "Keric, Zehren; right   apex" are three cores of soft tissue, ranging from   0.5-2.0 cm in length.  The cores of tissue are wrapped   and entirely submitted in F1.  (CR/cmm)       Benita Gutter MD  Resident  10/14/2017  Damita Lack True MD  Pathologist  Electronically signed 10/14/2017  In compliance with CMS regulations, the pathologist's   signature on this report indicates that the case has   been personally reviewed, and the diagnosis made or   confirmed by, the Attending Pathologist. Microscopic   examination was used to arrive at the diagnosis unless   indicated otherwise. Testing Location: [facility], [address], [place], [place]   [address] Ph: [phone] Fax: [phone].             ASSESSMENT: Patient is a 57 year old man with newly diagnosed clinical T1c Gleason 3+3 prostate cancer in nearly all the cores from the right side of the prostate.  Prostate MRI obtained last week without any significant lesion.  His pretreatment PSA is 7.3 ng/mL.  He has normal underlying urinary and sexual function.  Patient has ready scheduled robotic prostatectomy surgery with Dr. Pedro Earls at [facility] later this month.    PLAN:    1. Again he can consider his options: active surveillance, radiation, surgery.  We briefly discussed the radiation options in more detail including proton beam and stereotactic body radiotherapy.  2. We could consider obtaining a molecular marker such as Prolaris or Oncotype Dx or Decipher tests. I'm skeptical that these are meaningful.  3. I'd estimate 33-50%  likelihood of patient like you on active surveillance changing to active treatment.  4.  Since he has chosen surgery first treatment, I'd be happy to  perform it or continue as planned with Dr. Starleen Blue at [facility].  Counsel him that Dr. Starleen Blue is very experienced prostate surgeon and has excellent outcomes.  He will be in good hands surgically.  5. If continuing with Dr. Starleen Blue, then can follow with me as needed.  6. I encouraged him to remain healthy with good diet, exercise and keeping weight down.   7. I encouraged him to ask Dr. Kendrick Ranch office to request the MRI images to be pushed over to [facility] electronically.  8. I also encouraged him to send me a copy of the final surgical pathology report.    I spent a total time of 60 minutes face-to-face with the patient, of which more than 50% was spent counseling as outlined in the note above

## 2018-01-10 HISTORY — PX: PROSTATECTOMY: SHX5034

## 2018-01-12 NOTE — Discharge Summary (Signed)
Urology Discharge Summary    ID:  Jeffery Elliott; 57 y.o. male    Facility / Unit: [facility] Length of Stay: 0  Admit Date: 01/10/2018  Discharge Date: 01/12/18  PCP: Loraine Leriche D. Loreta Ave, MD  Attending: [name]., MD  Hospital Problem List There are no active problems to display for this patient.      Final Diagnosis  Prostate cancer  Left direct inguinal hernia    Other Pertinent Diagnosis / Findings  None    Complications   None    Significant Procedures   Robotic assisted laparoscopic prostatectomy (01/10/2018)  Left robotic inguinal hernia repair with mesh    Hospital Course  The patient was admitted after his operation (please see operative report for full details) and admitted to the surgical ward.     His pain was controlled well with oral medications.  His diet was advanced until he could tolerate a full regular diet without issues. He had return of bowel function.  He was able to ambulate without problems.  He received perioperative antibiotics and had no evidence of infection.     On the day of discharge, he had pain well controlled with oral medications, was able to tolerate a regular diet without issues, was able to walk, had no evidence of bleeding, had no evidence of infection, and otherwise felt ready for discharge.       Discharge Meds     Discharge Medications      New Medications      Details   oxyCODONE 5 mg tablet   Take 1 tablet by mouth every 6 hours as needed for Pain for up to 3 days.  aka:  ROXICODONE                Condition: Stable     Disposition / Follow-up: Home    [name], MD  7:36 AM; 01/12/2018    cc: Judith Blonder. Loreta Ave, MD

## 2018-07-14 ENCOUNTER — Telehealth (INDEPENDENT_AMBULATORY_CARE_PROVIDER_SITE_OTHER): Payer: Self-pay | Admitting: Urology

## 2018-07-14 NOTE — Telephone Encounter (Signed)
You have chosen to receive care through the use of telemedicine. Telemedicine enables health care providers at different locations to provide safe, effective, and convenient care through the use of technology. As with any health care service, there are risks associated with the use of telemedicine, including equipment failure, poor image resolution, and information security issues.     Do you understand the risks and benefits of telemedicine as I have explained them to you? Yes   You understand that your telemedicine visit will be billed to your insurance in the same manner as a regular in person visit? Yes   Have your questions regarding telemedicine been answered? Yes   Do you consent to the use of telemedicine in your medical care? Yes     You may receive additional information regarding your telehealth visit either via Ecare message or through a text message- which would you prefer? Text Message

## 2018-07-18 ENCOUNTER — Encounter (INDEPENDENT_AMBULATORY_CARE_PROVIDER_SITE_OTHER): Payer: Self-pay | Admitting: Urology

## 2018-07-18 ENCOUNTER — Telehealth (INDEPENDENT_AMBULATORY_CARE_PROVIDER_SITE_OTHER): Payer: No Typology Code available for payment source | Admitting: Urology

## 2018-07-18 DIAGNOSIS — N5231 Erectile dysfunction following radical prostatectomy: Secondary | ICD-10-CM

## 2018-07-18 DIAGNOSIS — C61 Malignant neoplasm of prostate: Secondary | ICD-10-CM

## 2018-07-18 NOTE — Progress Notes (Signed)
UROLOGY TELEHEALTH NOTE    ID/CC:    Prostate cancer and erectile dysfunction.    History of Present Illness:    Jeffery Elliott is a 58 year old male who returns  to me for prostate cancer and erectile dysfunction. Patient underwent robotic radical prostatectomy performed by Dr. Pedro Earls at [facility] on January 10, 2018 for a pT2NxR0 Gleason 3+3 prostate cancer with negative margins.  His first postoperative PSA on March 18, 2018 was undetectable.  He is recovered urinary continence.  He has persistent erectile dysfunction.  He tried oral phosphodiesterase 5 inhibitors without success and he experienced side effects including flushing.  He is interested in trying alternative therapy.        Review of systems:  A 14 system review of system was filled out by the patient.  Positives include some slight persistent discomfort at the incision sites especially umbilicus.  The rest of the review of system is negative and can be linked to in the chart dated today.      Past Medical Hx:    Patient Active Problem List   Diagnosis   . Nocturia   . Prostate cancer ([place])   . Inguinal hernia of left side without obstruction or gangrene       Past Surgical Hx:    Past Surgical History:   Procedure Laterality Date   . LIGATION PRQ VAS DEFERENS UNI/BI SPX  2014   . PROSTATECTOMY  01/10/2018       Social Hx:      reports that he has never smoked. He has never used smokeless tobacco. He reports previous alcohol use. He reports previous drug use..    Social History     Social History Narrative   . Not on file       Active Meds:    No outpatient medications have been marked as taking for the 07/18/18 encounter (Appointment) with Eloy End, MD.       OBJECTIVE:  Vital Signs:  There were no vitals taken for this visit.  Constitutional: No acute distress  No other exam for telehealth visit    Review of Medical Images, tracings or specimens:    Labs Reviewed today with the patient include  March 18, 2018 PSA: <0.1  ng/mL.    ASSESSMENT:  Jeffery Elliott is a 58 year old male with prostate cancer who underwent robotic prostatectomy in January 10, 2018 for a pT2NxR0 Gleason 3+3 prostate cancer with undetectable PSA with good recovery of continence but you has persistent erectile dysfunction and side effects with phosphodiesterase 5 inhibitors.  He is interested in other therapies for the erectile dysfunction.  He had normal preoperative function.    PLAN:    1.  We discussed the findings and options for erectile dysfunction.  2.  We discussed the etiology of post prostatectomy erectile dysfunction.  3.  We reviewed the options including trying an alternate phosphodiesterase 5 inhibitor, vacuum erection device, intraurethral alprostadil suppository, and intracavernosal injection therapy.  4.  I explained I believe an intracavernosal injection therapy is the best option.  I will prescribe this through compounding pharmacy.  The patient will review instructions online.  I will also be happy to review in person.  5.  He should undergo routine PSA follow-up at least every 6 to 12 months for the first few years after surgery and then annually.    Distant Site Telemedicine Encounter    I conducted this encounter from [facility] via secure, live,  face-to-face video conference with the patient. Jeffery Elliott was located at home by himself.  Prior to the interview, the risks and benefits of telemedicine were discussed with the patient and verbal consent was obtained.      I spent a total time of 25 minutes face-to-face with the patient, of which more than 50% was spent counseling and coordinating care as outlined in this note.

## 2018-07-18 NOTE — Patient Instructions (Addendum)
1.  We discussed the causes of post prostatectomy erectile dysfunction.  2.  We reviewed the options including trying an alternate phosphodiesterase 5 inhibitor oral tablet, vacuum erection device, intraurethral alprostadil suppository, and intracavernosal injection therapy.  3.  I believe intracavernosal injection therapy (self injection of medication into the penile tissue ) is the best option.  I will prescribe this through compounding pharmacy a two drug mixture.  The patient will review instructions online.  I will also be happy to review in person.    Videos:  [email or web address].kelly.[email or web address]  [email or web address]    Articles on PDF:  https://www-nejm-org.offcampus.lib..edu/doi/full/10.1056/NEJM200006153422407?url_ver=Z39.88-2003&rfr_id=ori:rid:crossref.org&rfr_dat=cr_pub%20%200pubmed    https://www.urologyhealth.org/urologic-conditions/erectile-dysfunction(ed)    4.  Please undergo routine PSA follow-up at least every 6 to 12 months for the first few years after surgery and then annually.

## 2018-08-03 ENCOUNTER — Telehealth (INDEPENDENT_AMBULATORY_CARE_PROVIDER_SITE_OTHER): Payer: Self-pay | Admitting: Nursing

## 2018-08-03 NOTE — Telephone Encounter (Signed)
Left VM for pt to call [facility] to follow up on injection medication.

## 2019-01-09 ENCOUNTER — Telehealth (INDEPENDENT_AMBULATORY_CARE_PROVIDER_SITE_OTHER): Payer: Self-pay | Admitting: Urology

## 2019-01-09 NOTE — Telephone Encounter (Signed)
RETURN CALL: Voicemail - Detailed Message      SUBJECT:  Form/Letter/Paperwork Request     DATE NEEDED BY: before end of December.  PICK UP/FAX/MAIL: depending per pt, please call first when this is done   ADDITIONAL INFORMATION: patient asking for a letter or the form. States he sent the link to providers email. Asking for current PSA score and status report from treating physician for [facility] for [employer]. Also asking where should he get a current PSA.  Please call back thank you

## 2019-01-11 ENCOUNTER — Telehealth (INDEPENDENT_AMBULATORY_CARE_PROVIDER_SITE_OTHER): Payer: No Typology Code available for payment source | Admitting: Urology

## 2019-01-11 ENCOUNTER — Telehealth (INDEPENDENT_AMBULATORY_CARE_PROVIDER_SITE_OTHER): Payer: Self-pay | Admitting: Nursing

## 2019-01-11 DIAGNOSIS — C61 Malignant neoplasm of prostate: Secondary | ICD-10-CM

## 2019-01-11 DIAGNOSIS — N5231 Erectile dysfunction following radical prostatectomy: Secondary | ICD-10-CM

## 2019-01-11 NOTE — Telephone Encounter (Signed)
Called pt to review medications and allergies prior to telemedicine visit and to see if PSA has been completed. Left VM for pt to call back.

## 2019-01-11 NOTE — Progress Notes (Signed)
UROLOGY TELEPHONE NOTE    ID/CC:    Prostate cancer and erectile dysfunction     History of Present Illness:    Jeffery Elliott is a 58 year old male who returns  to me for erectile dysfunction following robotic prostatectomy with Dr. Starleen Elliott at [facility] on 01/10/2018 for pT2NxMx Gleason 3+3 prostate cancer with <5% tertiary grade 4. PSA have been undetectable since.  At our last visit, he elected to trial intracavernosal injections for erectile dysfunction, not responsive to phosphodiesterase inhibitors.  He has been mainly using a vacuum erection device and also constriction ring.  He has had some success but he has found the ring to be uncomfortable.  He denies any incontinence at this point.          Review of systems:  A 14 system review of system was filled out by the patient.  Positives include erectile dysfunction. The rest of the review of system is negative and can be linked to in the chart dated today.      Past Medical Hx:    Patient Active Problem List   Diagnosis   . Nocturia   . Prostate cancer ([place])   . Inguinal hernia of left side without obstruction or gangrene   . Erectile dysfunction after radical prostatectomy       Past Surgical Hx:    Past Surgical History:   Procedure Laterality Date   . LIGATION PRQ VAS DEFERENS UNI/BI SPX  2014   . PROSTATECTOMY  01/10/2018       Social Hx:      reports that he has never smoked. He has never used smokeless tobacco. He reports previous alcohol use. He reports previous drug use..    Social History     Social History Narrative   . Not on file       Active Meds:    No outpatient medications have been marked as taking for the 01/11/19 encounter (Appointment) with Eloy End, MD.       OBJECTIVE:  No exam for telephone visit.    Review of Medical Images, tracings or specimens:    Labs Reviewed today with the patient include:  PSA on January 10, 2019 was undetectable at < 0.1 ng/mL.      ASSESSMENT:  58 year old man who underwent robotic prostatectomy Dr. Starleen Elliott  at [facility] on 01/10/2018 for pT2NxMx Gleason 3+3 prostate cancer with <5% tertiary grade 4.  His PSA remains undetectable.  He has had a good recovery in terms of continence but has erectile dysfunction.  He has not had a good response with penile intracavernosal injection therapy but more satisfied with the vacuum erection device and constriction ring but not entirely happy with it.    PLAN:    1.  We will continue to follow with PSA testing every 6 months.  2.  We discussed the options for erectile dysfunction including an alternative vacuum erection device that we could prescribe with different sizes of tension rings.  I also encouraged him to reconsider the penile intracavernosal injection therapy and titrating up the dose to a satisfactory level.  For the injection we could try either Bimix or Trimix.  3.  The patient will let me know his preferences for erectile dysfunction treatment or if he needs any prescriptions.  4.  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

## 2019-04-18 ENCOUNTER — Ambulatory Visit (HOSPITAL_BASED_OUTPATIENT_CLINIC_OR_DEPARTMENT_OTHER): Payer: No Typology Code available for payment source

## 2019-04-18 DIAGNOSIS — Z23 Encounter for immunization: Secondary | ICD-10-CM

## 2019-05-05 ENCOUNTER — Ambulatory Visit (HOSPITAL_BASED_OUTPATIENT_CLINIC_OR_DEPARTMENT_OTHER): Payer: Self-pay

## 2019-05-05 DIAGNOSIS — Z23 Encounter for immunization: Secondary | ICD-10-CM

## 2019-11-15 ENCOUNTER — Ambulatory Visit (HOSPITAL_BASED_OUTPATIENT_CLINIC_OR_DEPARTMENT_OTHER): Payer: No Typology Code available for payment source

## 2019-11-15 ENCOUNTER — Ambulatory Visit: Payer: No Typology Code available for payment source | Attending: Urology | Admitting: Urology

## 2019-11-15 ENCOUNTER — Encounter (HOSPITAL_COMMUNITY): Payer: Self-pay

## 2019-11-15 VITALS — BP 125/81 | HR 58 | Temp 98.2°F | Wt 230.0 lb

## 2019-11-15 DIAGNOSIS — Z23 Encounter for immunization: Secondary | ICD-10-CM

## 2019-11-15 DIAGNOSIS — N5231 Erectile dysfunction following radical prostatectomy: Secondary | ICD-10-CM | POA: Insufficient documentation

## 2019-11-15 DIAGNOSIS — C61 Malignant neoplasm of prostate: Secondary | ICD-10-CM | POA: Insufficient documentation

## 2019-11-15 MED ORDER — SILDENAFIL CITRATE 20 MG OR TABS
20.0000 mg | ORAL_TABLET | Freq: Every day | ORAL | 11 refills | Status: DC | PRN
Start: 2019-11-15 — End: 2021-12-29

## 2019-11-15 NOTE — Patient Instructions (Addendum)
1. Continue to monitor the PSA at least every 6 months for 3 years then annually.  2. Let's try Revatio 20 mg for the erectile dysfunction. Titrate up to 100 mg for effect.    Surgery for Erectile Dysfunction (Implants)  Surgery for erectile dysfunction (ED) is not common, but it may be the best treatment in some cases. During surgery, your healthcare provider places an implant (also called a prosthesis) inside the spongy chambers ofyour penis. Then, the implant can be used to provide an erection.      Mechanical implants  This type of implant is easy to use. Bendable rods can makeyour penis appear erect. When not in use, the rods can be bent downward. Some implants have joints that lock the implant into position.  Inflatable implants  This is the most complex type of implant. It allows your penis to look and feel either erect or flaccid. You pump fluid from a storage bulb to makeyour penis erect. A release valve makesyour penis flaccid again. Using the device properly takes some skill and practice.  Risks and possible complications   Infection   Bleeding   Failure or leakage of the prosthesis   Wearing down of the prosthesis    StayWell last reviewed this educational content on 06/26/2017   2000-2020 [employer]. [address] [place] [address]. All rights reserved. This information is not intended as a substitute for professional medical care. Always follow your healthcare professional's instructions.                 Understanding an Erection  The penis is made up of spongy tissue that holds blood. When the penis is soft, blood flows in and out of the tissue. During sexual excitement, extra blood flows into the tissue. The extra blood makes the tissue swell. The penis then becomes hard (erect). This makes it firm enough to have sex. Read more about the stages of an erection below.     A flaccid penis       An erect penis      Stages of an erection  An erection requires a healthy  mind-body team effort led by the brain. First the brain sends out signals. Then the blood vessels, nerves, and hormones work together to create and keep an erection.  The soft penis  When a man is not aroused, then the brain, nerves, blood vessels, and hormones won't start working to cause an erection. The amount of blood flowing into the penis's spongy chambers (corpora cavernosa and corpus spongiosum) equals the amount flowing out. The penis stays soft.  The swollen penis  A man gets aroused by his senses. This includes sight and touch. And it includes thoughts, such as memories or fantasies. During arousal, messages brought by nerves cause the blood vessels and spongy chambers to open up (dilate). More blood flows into the penis than flows out. The penis starts to swell.    The erect penis  As arousal continues, nerves keep carrying arousal messages between the penis and brain. Blood keeps moving into the man's penis. Blood-swollen tissues press against the veins. Some of the blood is kept from flowing back out. Filled with blood, the penis becomes hard. The man is able to have sex.  When theres a problem  Physical or psychological problems can keep the tissue in the penis from filling with extra blood or from holding the extra blood in. When this happens, the penis stays soft. Or the  penis gets hard but wont stay hard. This is called erectile dysfunction (ED).  StayWell last reviewed this educational content on 06/26/2017   2000-2020 [employer]. [address], [place] [address]. All rights reserved. This information is not intended as a substitute for professional medical care. Always follow your healthcare professional's instructions.

## 2019-11-15 NOTE — Progress Notes (Signed)
UROLOGY CLINIC NOTE    ID/CC:    Prostate cancer and post-RP erectile dysfunction    History of Present Illness:    Jeffery Elliott is a 59 year old male who returns to me for prostate cancer. He underwent robotic prostatectomy on 01/10/18 with bilateral nerve sparing technique. Pathology was of a pT2NxR0 Gleason 3+3 prostate cancer. His PSA remains undetectable. His PSA on September 13, 2019 was <0.1 ng/mL. No incontinence. No pad use in one year. Has tried variety of options for erectile dysfunction. Only briefly tried penile injection. Has used vacuum device. Interested in trying oral PDE5-I again.         Review of Systems:  A 14 system review of system was filled out by the patient.  Positives include erectile dysfunction. The rest of the review of system is negative and can be linked to in the chart dated today.    Past Medical Hx:    Patient Active Problem List   Diagnosis   . Nocturia   . Prostate cancer ([place])   . Inguinal hernia of left side without obstruction or gangrene   . Erectile dysfunction after radical prostatectomy       Past Surgical Hx:    Past Surgical History:   Procedure Laterality Date   . LIGATION PRQ VAS DEFERENS UNI/BI SPX  2014   . PROSTATECTOMY  01/10/2018       Social Hx:      reports that he has never smoked. He has never used smokeless tobacco. He reports previous alcohol use. He reports previous drug use..    Social History     Social History Narrative   . Not on file       Active Meds:    Outpatient Medications Marked as Taking for the 11/15/19 encounter (Office Visit) with Eloy End, MD   Medication Sig Dispense Refill   . sildenafil 20 MG tablet Take 1 tablet (20 mg) by mouth daily as needed (ED). 30 tablet 11       OBJECTIVE:  Vital Signs:  BP 125/81   Pulse 58   Temp 36.8 C (Tympanic)   Wt (!) 104.3 kg (230 lb)   SpO2 97%   BMI 31.19 kg/m   Constitutional: No acute distress  Abdomen: No abdominal masses.   No tenderness, No CVA tenderness. No hernias noted.  Extremities:   No edema    Review of Medical Images, tracings or specimens:    Labs Reviewed today with the patient include     ASSESSMENT:  Jeffery Elliott is a 59 year old male with prostate cancer s/p robotic prostatectomy on 01/10/18 for pT2NxR0 Gleason 3+3 prostate cancer with undetectable PSA. Has recovered continence and without pad use. Still has post-RP erectile dysfunction and interested in trying oral PDE5-I again.     PLAN:    1. I explained he should continue to monitor the PSA at least every 6 months for 3 years then annually. He will go this with his PCP as he's been doing.  2. I prescribed Revatio 20 mg for the erectile dysfunction. Titrate up to 100 mg for effect.   3. Discussed other options for erectile dysfunction if oral PDE5-I not successful. Mainly penile injection and surgical implant.  4. He will let me know his response to sildenafil and how he'd like to pursue erectile dysfunction.   5. I wrote an [employer] pl[employer] letter on his behalf.

## 2020-10-18 ENCOUNTER — Telehealth (HOSPITAL_BASED_OUTPATIENT_CLINIC_OR_DEPARTMENT_OTHER): Payer: Self-pay | Admitting: Nursing

## 2020-10-18 DIAGNOSIS — C61 Malignant neoplasm of prostate: Secondary | ICD-10-CM

## 2020-10-18 NOTE — Telephone Encounter (Signed)
Sent e-care message to pt re: psa lab and to complete in preparation for appt.

## 2020-10-21 ENCOUNTER — Encounter (HOSPITAL_BASED_OUTPATIENT_CLINIC_OR_DEPARTMENT_OTHER): Payer: Self-pay | Admitting: Nursing

## 2020-10-21 ENCOUNTER — Ambulatory Visit: Payer: No Typology Code available for payment source | Attending: Urology | Admitting: Urology

## 2020-10-21 VITALS — Ht 72.0 in | Wt 220.0 lb

## 2020-10-21 DIAGNOSIS — C61 Malignant neoplasm of prostate: Secondary | ICD-10-CM | POA: Insufficient documentation

## 2020-10-21 DIAGNOSIS — N5231 Erectile dysfunction following radical prostatectomy: Secondary | ICD-10-CM | POA: Insufficient documentation

## 2020-10-21 NOTE — Progress Notes (Signed)
UROLOGY CLINIC NOTE    ID/CC:    Prostate cancer and post-RP erectile dysfunction    History of Present Illness:    Jeffery Elliott is a 60 year old male who returns to me for prostate cancer. He underwent robotic prostatectomy on 01/10/18 with bilateral nerve sparing technique. Pathology was of a pT2NxR0 Gleason 3+3 prostate cancer. His PSA remains undetectable. His PSA on September 02, 2020 was <0.04 ng/mL. No incontinence. No pad use in one year. Has tried variety of options for erectile dysfunction. Only briefly tried penile injection. Has used vacuum device.  He was interested in trying sildenafil but has not been so reliably taking it.  Difficult to assess his current degree of erectile dysfunction.        Review of Systems:  A 14 system review of system was filled out by the patient.  Positives include continence recovered. The rest of the review of system is negative and can be linked to in the chart dated today.    Past Medical Hx:    Patient Active Problem List   Diagnosis    Nocturia    Prostate cancer ([place])    Inguinal hernia of left side without obstruction or gangrene    Erectile dysfunction after radical prostatectomy       Past Surgical Hx:    Past Surgical History:   Procedure Laterality Date    LIGATION PRQ VAS DEFERENS UNI/BI SPX  2014    PROSTATECTOMY  01/10/2018       Social Hx:      reports that he has never smoked. He has never used smokeless tobacco. He reports previous alcohol use. He reports previous drug use..    Social History     Social History Narrative    Not on file       Active Meds:    Outpatient Medications Marked as Taking for the 10/21/20 encounter (Telemedicine) with Eloy End, MD   Medication Sig Dispense Refill    sildenafil 20 MG tablet Take 1 tablet (20 mg) by mouth daily as needed (ED). 30 tablet 11       OBJECTIVE:  Vital Signs:  There were no vitals taken for this visit.  Constitutional: No acute distress  No exam for telehealth visit.      Review of Medical Images,  tracings or specimens:    Labs Reviewed today with the patient include    ASSESSMENT:  Jeffery Elliott is a 60 year old male with prostate cancer s/p robotic prostatectomy on 01/10/18 for pT2NxR0 Gleason 3+3 prostate cancer with undetectable PSA. Has recovered continence and without pad use. 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    PLAN:    1. I explained he should continue to monitor the PSA at least every 6 months for 3 years then annually.   He will have this checked with his PCP with routine labs.  2. We discussed options for erectile dysfunction including using an alternate longer-lasting oral PDE5-I or other options.  Patient will consider these.  3. Follow in 6 months.  4. I wrote an [employer] pl[employer] letter on his behalf.    Distant Site Telemedicine Encounter      I conducted this encounter via secure, live, face-to-face video conference with the patient. I reviewed the risks and benefits of telemedicine as pertinent to this visit and the patient agreed to proceed.    Provider Location: On-site location (clinic, hospital, on-site office)  Patient Location: At home  Present with patient: No one  else present     I spent a total of 20 minutes for the patient's care on the date of the service.

## 2020-10-21 NOTE — Patient Instructions (Signed)
1.  Continue sildenafil for the erectile dysfunction but consider options including longer-lasting oral medication such as tadalafil.  2.  Follow in 6 months with repeat PSA.  Okay to have PSA done drawn with your PCP.  3.  Please let me know of any issues with the [place] letter.

## 2020-10-21 NOTE — Progress Notes (Signed)
Completed Medical Release Form scanned per Dr. Roetta Sessions instructions.

## 2020-10-23 ENCOUNTER — Encounter (HOSPITAL_BASED_OUTPATIENT_CLINIC_OR_DEPARTMENT_OTHER): Payer: Self-pay | Admitting: Nursing

## 2021-12-29 ENCOUNTER — Encounter (HOSPITAL_BASED_OUTPATIENT_CLINIC_OR_DEPARTMENT_OTHER): Payer: Self-pay | Admitting: Urology

## 2021-12-29 ENCOUNTER — Telehealth (HOSPITAL_BASED_OUTPATIENT_CLINIC_OR_DEPARTMENT_OTHER): Payer: No Typology Code available for payment source | Admitting: Urology

## 2021-12-29 DIAGNOSIS — N5231 Erectile dysfunction following radical prostatectomy: Secondary | ICD-10-CM

## 2021-12-29 DIAGNOSIS — C61 Malignant neoplasm of prostate: Secondary | ICD-10-CM

## 2021-12-29 MED ORDER — SILDENAFIL CITRATE 20 MG OR TABS
20.0000 mg | ORAL_TABLET | Freq: Every day | ORAL | 11 refills | Status: AC | PRN
Start: 2021-12-29 — End: ?

## 2021-12-29 NOTE — Progress Notes (Signed)
UROLOGY TELEHEALTH FOLLOW-UP NOTE    ID/CC:    Chief Complaint   Patient presents with    Telemedicine    Forms Completion     [employer], Rx refill       History of Present Illness:    Jeffery Elliott is a 61 year old male who returns to me for prostate cancer. He underwent robotic prostatectomy on January 10, 2018 with bilateral nerve sparing technique performed by Dr. Birdena Jubilee at [facility]. Pathology was of a pT2NxR0 Gleason 3+3 prostate cancer. His PSA remains undetectable including most recently on August 29, 2021 was <0.1 ng/mL.     He continues to do well from status of continence.  He is not wearing any pads.  He is having some recovery erectile dysfunction and some response to sildenafil.  He is requesting a renewal on the sildenafil.    Review of Systems:  A 14 system review of system was filled out by the patient.  Positives include erectile dysfunction. The rest of the review of system is negative and can be linked to in the chart dated today.    Past Medical Hx:    Patient Active Problem List   Diagnosis    Nocturia    Prostate cancer ([place])    Inguinal hernia of left side without obstruction or gangrene    Erectile dysfunction after radical prostatectomy       Past Surgical Hx:    Past Surgical History:   Procedure Laterality Date    LIGATION PRQ VAS DEFERENS UNI/BI SPX  2014    PROSTATECTOMY  01/10/2018       Social Hx:      reports that he has never smoked. He has never used smokeless tobacco. He reports that he does not currently use alcohol. He reports that he does not currently use drugs..    Social History     Social History Narrative    Not on file       Active Meds:    Outpatient Medications Marked as Taking for the 12/29/21 encounter (Telemedicine) with Eloy End, MD   Medication Sig Dispense Refill    [DISCONTINUED] sildenafil 20 MG tablet Take 1 tablet (20 mg) by mouth daily as needed (ED). 30 tablet 11     Outpatient Medications Marked as Taking for the 10/21/20 encounter  (Telemedicine) with Eloy End, MD   Medication Sig Dispense Refill    sildenafil 20 MG tablet Take 1 tablet (20 mg) by mouth daily as needed (ED). 30 tablet 11       OBJECTIVE:  No exam for telehealth visit.    Review of Medical Images, tracings or specimens:    Labs Reviewed today with the patient include  PSA 08/29/2021 <0.1 ng/mL      ASSESSMENT:  Jeffery Elliott is a 61 year old male with prostate cancer after undergoing robotic prostatectomy on January 10, 2018 for a pT2NxR0 Gleason 3+3 prostate cancer with undetectable PSA from August 29, 2021 <0.1 ng/mL. Patient recovered well and no longer using pads.  Having erectile dysfunction variably responsive to oral sildenafil.      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  We renewed his sildenafil for erectile dysfunction.  Please keep me updated if he would like to pursue more aggressive options again for the erectile dysfunction.  Follow-up in 6 months with PSA.         Distant Site Telemedicine Encounter  I conducted this encounter  via secure, live, face-to-face video conference with the patient. I reviewed the risks and benefits of telemedicine as pertinent to this visit and the patient agreed to proceed.    Provider Location: On-site location (clinic, hospital, on-site office)  Patient Location: At home  Present with patient: No one else present         I, Noemi Chapel, medical scribe, attest that this documentation has been prepared under the direction and in the presence of Dr. Eloy End, MD.    I, Eloy End, MD, personally performed the services described in this documentation, as scribed by Noemi Chapel, in my presence.  I have reviewed and edited the documentation provided by the scribe, and attest that it is both accurate and complete.     Eloy End, MD  Urology

## 2021-12-29 NOTE — Patient Instructions (Addendum)
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  We renewed his sildenafil for erectile dysfunction.  Please keep me updated if he would like to pursue more aggressive options again for the erectile dysfunction.  Follow-up in 6 months with PSA.      Thank you for coming to [facility] today for your care! Today you were seen in clinic by Dr. Eloy End, MD.

## 2021-12-30 ENCOUNTER — Telehealth (HOSPITAL_BASED_OUTPATIENT_CLINIC_OR_DEPARTMENT_OTHER): Payer: Self-pay | Admitting: Urology

## 2021-12-30 NOTE — Telephone Encounter (Signed)
Sent letter to patient by My Chart message.

## 2021-12-30 NOTE — Progress Notes (Signed)
I, Audry Pecina, MD, personally performed the services described in this documentation, as scribed by Sarah Qureshi, in my presence.  I have reviewed and edited the documentation provided by the scribe, and attest that it is both accurate and complete.    Cheyne Bungert, MD  Urology      Distant Site Telemedicine Encounter  I conducted this encounter via secure, live, face-to-face video conference with the patient. I reviewed the risks and benefits of telemedicine as pertinent to this visit and the patient agreed to proceed.    Provider Location: On-site location (clinic, hospital, on-site office)  Patient Location: At home  Present with patient: No one else present       I spent a total of 30 minutes for the patient's care on the date of the service.

## 2022-10-02 NOTE — Progress Notes (Signed)
 Jeffery Elliott is a 62 y.o. male     Chief Complaint:  Annual Exam      Assessment And Plans (Preventative Care):     Appropriate Health maintenance issues were discussed with the patient based on age, sex and other co-morbidities. Lifestyle counseling was also done.       ICD-10-CM ICD-9-CM    1. Encounter for physical examination  Z00.00 V70.9 CANCELED: PSA, Ultrasensitive      CANCELED: PSA, Ultrasensitive      2. History of prostate cancer  Z85.46 V10.46 Lipid Panel with Chol/HDL Ratio      CBC no Differential      Comprehensive Metabolic Panel      TSH, Reflex Free T4      Hemoglobin A1C      HCV Antibody Reflex HCV, NAAT, Quantitative      HIV AG/AB, 4th Gen, Reflex      Lipid Panel with Chol/HDL Ratio      CBC no Differential      Comprehensive Metabolic Panel      TSH, Reflex Free T4      Hemoglobin A1C      HCV Antibody Reflex HCV, NAAT, Quantitative      HIV AG/AB, 4th Gen, Reflex      3. Foot pain, left  M79.672 729.5 XR Foot Left 3 + Vw        Overall doing well  Labs as ordered  Recently had PSA which was within normal limits  Recommended COVID, flu, RSV vaccines at pharmacy.  Tdap will be done today.  [facility]orted[facility], will request records.  Foot pain, suspect Morton's neuroma.  Will start with a foot x-ray and he will go back to see podiatry.    No follow-ups on file.      Bernerd Meter, DO      History of Present Illness:    1.) Preventative Healthcare   -Due for flu shot, COVID-vaccine, RSV vaccine.  [facility]orted[facility].    2.) other issues  Has some left forefoot pain between his second and third toes.  Has been present for about 3 or 4 months.  Worse when he walks.  Saw podiatrist recently but this was more for ingrown toenail.    Health Maintenance Alerts:    Health Maintenance Due   Topic Date Due   . Hepatitis C Screening  Never done   . Human Immunodeficiency Virus (HIV) Screening  Never done   . Colorectal Combination Topic   Never done   . Vaccine: RSV Adult (1 - 1-dose 60+ series) Never done   . Vaccine: Influenza (1) 09/27/2022       Health Maintenance   Topic Date Due   . Hepatitis C Screening  Never done   . Human Immunodeficiency Virus (HIV) Screening  Never done   . Colorectal Combination Topic  Never done   . Vaccine: RSV Adult (1 - 1-dose 60+ series) Never done   . Vaccine: Influenza (1) 09/27/2022   . Vaccine: Dtap/Tdap/Td (2 - Td or Tdap) 10/01/2032   . COVID-19 Vaccine  Completed   . Vaccine: Zoster  Completed       Allergies (reviewed):  Allergies   Allergen Reactions   . Dust Mite Extract Other (See Comments)     Sneezing    . Molds & Smuts Dermatitis   . Pollen Extract Sensitivity         Medications (reviewed):  Current Outpatient Medications on File Prior to Visit   Medication Sig Dispense Refill   . PROCTO-MED HC 2.5 % rectal cream APPLY THIN LAYER TO AFFECTED AREAS 2 TO 4 TIMES DAILY     . sildenafil  (REVATIO ) 20 mg tablet Take 1 tablet by mouth Daily as needed.       No current facility-administered medications on file prior to visit.       Problem List (reviewed):  Patient Active Problem List   Diagnosis   . History of prostate cancer       Immunizations:  Immunization History   Administered Date(s) Administered   . COVID-19 (MODERNA) 12 YRS+, 0.5 ML (Current) 10/17/2021   . COVID-19 (MODERNA) BIVALENT 12 YRS+ 0.5 ML 10/03/2020   . COVID-19 (PFIZER) MONOVALENT 12 YRS+ (ORIGINAL) 04/18/2019, 05/05/2019, 11/15/2019   . INFLUENZA PF QUADRIVALENT (FLUCELVAX) 10/03/2020, 10/17/2021   . INFLUENZA PF, QUADRIVALENT 10/08/2017, 11/19/2019   . TDAP, (ADOL/ADULT) 10/02/2022   . ZOSTER NON-LIVE [facility]) 11/12/2018, 11/19/2019, 05/03/2020       Past Medical History (reviewed):  Past Medical History:   Diagnosis Date   . Malignant neoplasm of prostate [facility])     Robotic Assisted SI Prostectomy Retro Pubic scheduled 01/10/2018         Past Surgical History (reviewed)  Past Surgical History:   Procedure Laterality Date   .  PROSTATECTOMY N/A 01/10/2018    Procedure: ROBOTIC ASSISTED SI PROSTATECTOMY RETROPUBIC;  Surgeon: Lynwood JONELLE Fayette Mickey., MD;  Location: [facility] MAIN OR   . INGUINAL HERNIA REPAIR Left 01/10/2018    Procedure: LAPAROSCOPIC REPAIR HERNIA  INGUINAL  WITH VENTRALIGHT MESH,WITH CAPSURE DEVICE .;  Surgeon: Lynwood JONELLE Fayette Mickey., MD;  Location: [facility] MAIN OR   . WRIST SURGERY      as a child cut on a window        Family History (reviewed):   Family History   Problem Relation Age of Onset   . Colon cancer Mother    . Liver cancer Mother    . Heart attack Father    . Coronary artery disease Brother        Social History:   Social History     Socioeconomic History   . Marital status: Married     Spouse name: Not on file   . Number of children: Not on file   . Years of education: Not on file   . Highest education level: Not on file   Occupational History   . Not on file   Tobacco Use   . Smoking status: Never     Passive exposure: Never   . Smokeless tobacco: Never   Vaping Use   . Vaping status: Never Used   Substance and Sexual Activity   . Alcohol use: Not Currently     Comment: Quit a year ago   . Drug use: Never   . Sexual activity: Yes     Partners: Female   Other Topics Concern   . Not on file   Social History Narrative    10/02/22: works at [employer]: Low Risk  (10/02/2022)    Overall Financial Resource Strain (CARDIA)    . Difficulty of Paying Living Expenses: Not hard at all   Food Insecurity: No Food Insecurity (10/02/2022)    Hunger Vital Sign    . Worried About Programme researcher, broadcasting/film/video in the Last Year: Never true    .  Ran Out of Food in the Last Year: Never true   Transportation Needs: No Transportation Needs (10/02/2022)    PRAPARE - Transportation    . Lack of Transportation (Medical): No    . Lack of Transportation (Non-Medical): No   Physical Activity: Insufficiently Active (10/02/2022)    Exercise Vital Sign    . Days of Exercise per Week: 3 days    . Minutes of  Exercise per Session: 30 min   Stress: Not on file   Social Connections: Not on file   Intimate Partner Violence: Not At Risk (10/02/2022)    HARK    . Fear of Current or Ex-Partner: No    . Emotionally Abused: No    . Physically Abused: No    . Sexually Abused: No       Review of Systems:    Constitutional:  No fever, chills, sweats, fatigue/weakness, and unexpected weight change.   Eyes:  No concerning vision changes.   ENT:  No earache, tinnitus, decreased hearing, nasal congestion, nosebleeds, sore throat, and hoarseness.   Breast: No pain, mass, skin change, nipple discharge.   Resp:   No cough, dyspnea at rest, dyspnea on exertion, excessive sputum, or wheezing.   CV:  No chest pain with exertion, palpitations, lightheadedness, syncope, dyspnea, orthopnea, PND, peripheral edema, or claudication.   GI:  No trouble swallowing, heartburn, nausea, vomiting, abdominal pain, diarrhea, constipation, melena, or blood in stool.   GU No dysuria, hematuria, urinary frequency, or incontinence. No testicular pain or masses, no discharge  Or decreased libido or sexual dysfunction   Musculoskeletal: Positive for left forefoot pain   Derm: No rash, itching, dryness, and suspicious lesions.   Neurologic: No frequent headaches, transient focal weakness, transient vision loss in one eye, seizures, tremors, numbness or tingling in hands or feet, vertigo, and fall or difficulty walking in past 6 months   Psych No depression or anxiety. See PHQ 2/9 for more detail   Endo No polydipsia, polyuria, polyphagia, cold intolerance, heat intolerance, or unusual weight change.   Heme No abnormal bruising, bleeding, or enlarged lymph nodes.   Allergy No urticaria, allergic rash, hay fever         Physical Examination     Vitals:    10/02/22 0850   BP: 118/72   Pulse: 60   Weight: 95.3 kg (210 lb)   Height: 1.795 m (5' 10.67)     Body mass index is 29.56 kg/m.    General Appearance:  Alert, cooperative, no distress   Head:  No obvious  abnormality, atraumatic   Eyes:  PERRL, conjunctiva/corneas clear, EOM's grossly normal   Ears:  Normal TM's, external auditory canals   Nose: Nares normal, no drainage   Throat: Lips, mucosa, and tongue normal; teeth and gums normal   Neck: Supple, symmetrical, no adenopathy, thyroid: not enlarged, symmetric, no tenderness/mass   Back:   Symmetric, ROM normal, no CVA tenderness   Lungs:   Clear to auscultation bilaterally   Cardiovascular:  Regular rate and rhythm, S1, S2 normal, no murmur, no peripheral edema   Abdomen:   Soft, non-tender, no obvious masses   Genitalia:  Deferred    Extremities: Extremities normal, no edema   Skin: No suspicious appearing nevi or rashes   Lymph nodes: Cervical, supraclavicular nodes normal   Neurologic: CN II-XII grossly intact.   Gait normal.  DTRs symmetrical

## 2022-10-02 NOTE — Progress Notes (Signed)
 Jeffery Elliott is a(n) 62 y.o. male who presents to clinic for an annual exam.   Left forefoot pain as well.   Discuss flu/covid vaccines.   Discuss labs.

## 2022-10-03 LAB — OTHER LAB ORDER
Hemoglobin A1C: 5.5 % (ref 4.8–5.6)
Mean Blood Glucose Estimate: 111 mg/dL

## 2023-04-12 ENCOUNTER — Telehealth (HOSPITAL_BASED_OUTPATIENT_CLINIC_OR_DEPARTMENT_OTHER): Payer: Self-pay

## 2023-04-12 NOTE — Telephone Encounter (Signed)
 RETURN CALL: Voicemail - Detailed Message      SUBJECT:  General Message     MESSAGE: Patient received call from Dr. Charlynn Court care team. Patient called back.    He would like a call back to resume this conversation, and eagerly awaits our call at [phone]

## 2023-04-15 ENCOUNTER — Ambulatory Visit
Admission: RE | Admit: 2023-04-15 | Discharge: 2023-04-15 | Disposition: A | Discharge status: Home / Self Care | Source: Ambulatory Visit | Attending: Diagnostic Radiology | Admitting: Diagnostic Radiology

## 2023-04-15 ENCOUNTER — Encounter (HOSPITAL_BASED_OUTPATIENT_CLINIC_OR_DEPARTMENT_OTHER): Payer: Self-pay

## 2023-04-15 ENCOUNTER — Ambulatory Visit (HOSPITAL_BASED_OUTPATIENT_CLINIC_OR_DEPARTMENT_OTHER)

## 2023-04-15 VITALS — BP 104/68 | HR 52 | Temp 98.2°F | Ht 72.0 in | Wt 207.0 lb

## 2023-04-15 DIAGNOSIS — M25531 Pain in right wrist: Secondary | ICD-10-CM | POA: Insufficient documentation

## 2023-04-15 DIAGNOSIS — M25532 Pain in left wrist: Secondary | ICD-10-CM

## 2023-04-15 DIAGNOSIS — G5603 Carpal tunnel syndrome, bilateral upper limbs: Secondary | ICD-10-CM | POA: Insufficient documentation

## 2023-04-15 NOTE — Progress Notes (Signed)
 I have personally seen and evaluated the above patient, confirmed the above exam findings and reviewed the resident/fellow documentation. I agree with the plan as outlined      Colen Darling, MD  Assistant Professor  Dept of Orthopaedics and Sports Medicine

## 2023-04-15 NOTE — Progress Notes (Signed)
 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 Patient is does a lot of work with Physicist, medical. He presents today with bilateral wrist pain and intermittent numbness and tingling in bilateral hands. The patient has a remote history of multiple sprains to the wrist in the past. However, he endorses more recent onset of numbness and tingling sensation at the base of his thumb, index, middle fingers, and occasional ring finger as well as diffuse dorsal wrist and hand pain for the past 2 weeks. 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  He has started using braces when he types on the keyboard and at night, and he has been elevating both wrists, all of which has helped with his symptoms.      Past Medical History:  Past Medical History:   Diagnosis Date    Prostate cancer (HCC) 10/19/2017    Asthma (HCC)        Medications:  Current Outpatient Medications   Medication Sig Dispense Refill    sildenafil 20 MG tablet Take 1 tablet (20 mg) by mouth daily as needed (ED). 30 tablet 11     No current facility-administered medications for this visit.     Allergies:   Review of patient's allergies indicates:  Allergies   Allergen Reactions    Dust Mite Extract     Molds & Smuts     Pollen Extract      Past Surgical History:  Past Surgical History:   Procedure Laterality Date    LIGATION PRQ VAS DEFERENS UNI/BI SPX  2014    PROSTATECTOMY  01/10/2018     Social History:   He is married and has 2 children.     Social History     Substance and Sexual Activity   Alcohol Use Not Currently     Social History     Tobacco Use   Smoking Status Never   Smokeless Tobacco Never       ROS / Focused Surgical Past Medical History Review:    MRSA within last year: No  History of TB: No  Diabetes: No  History of Stroke: No   History of  Seizure: No   Heart / Lung Problem: No   Kidney / Liver Problem: No   History of Organ Transplant: No   Prescription Pain Meds: None  Blood Thinner Meds: None     Body mass index is 28.07 kg/m.    Hand & Upper Extremity Examination    Physical Examination  Gen: Patient is healthy, alert, no distress    Psych: Alert and oriented times 3  Pleasant male. Mood and affect appropriate.    Cardiac:  2+ radial pulse.Right and Left     Lungs:  Breathing comfortably on room air    Skin: warm, normal color and sweat patterns.  no abrasions, lacerations, or ecchymosis.    Vascular: Fingers warm, pink, with brisk capillary refill    Neurologic: Sensation to light touch grossly intact over the median, radial, and ulnar distributions    Musculoskeletal:    RUE  Mild TTP about the dorsal wrist and dorsal hand.  No pain with axial load to finger MP joints, thumb CMC, thumb MP joint  Mild TTP about the thumb CMC joint  Mildly  positive Tinel's at the wrist and elbow  Positive Durkan's at the wrist after 3 min  Negative Finkelstein's   5/5 APB  5/5 FDI, hypothenar    LUE  No TTP about the thumb CMC, thumb MP joint, dorsal hand  Negative Tinel's at the wrist and elbow  Negative Durkan's   5/5 ABP  5/5 FDI, hypothenar    Studies:  X-rays of the bilateral wrists were reviewed and show no bony abnormality. Mild arthritic changes at the right thumb CMC joint.     Assessment:   Jeffery Elliott is a 63 year old RHD male who works as a Higher education careers adviser for [employer] who presents with mild bilateral carpal tunnel syndrome and mild right extensor tendinitis. Diagnosis and relevant imaging and exam findings discussed with the patient today. For his carpal tunnel syndrome, we discussed conservative care with bracing, NSAIDs, avoidance of prolonged wrist flexion as well as surgical options for the patient. At this time, patient's carpal tunnel symptoms are intermittent and are responsive to bracing. Discussed monitoring his symptoms for any  progression or if bracing no longer helps. Regarding his dorsal wrist/hand pain, discussed continued conservative management with bracing, NSAIDs, ergonomic changes for his computer work to avoid exacerbation of his symptoms. The patient expressed understanding and all of his questions were answered.     Plan:  - Continue bracing, particularly at night  - Return to clinic as needed. If carpal tunnel symptoms progress, can consider obtaining EMG/NCS to evaluate median nerve.     Rande Brunt, MD  Fellow,  Hand and Microsurgery

## 2023-04-16 NOTE — Telephone Encounter (Signed)
 Pt seen in clinic with Dr Eustace Pen yesterday 04/15/23    Closing TE    Blane Ohara, RN  [facility]  [address] [place], [place] [address]  [phone] ph  [phone] fax
